# Patient Record
Sex: Female | Born: 1960 | Race: White | Hispanic: No | Marital: Single | Smoking: Never smoker
Health system: Southern US, Community
[De-identification: ages and names within clinical notes are randomized; demographics above are authoritative.]

## PROBLEM LIST (undated history)

## (undated) DIAGNOSIS — Z8619 Personal history of other infectious and parasitic diseases: Secondary | ICD-10-CM

## (undated) DIAGNOSIS — K219 Gastro-esophageal reflux disease without esophagitis: Secondary | ICD-10-CM

## (undated) DIAGNOSIS — T7840XA Allergy, unspecified, initial encounter: Secondary | ICD-10-CM

## (undated) HISTORY — DX: Gastro-esophageal reflux disease without esophagitis: K21.9

## (undated) HISTORY — DX: Personal history of other infectious and parasitic diseases: Z86.19

## (undated) HISTORY — DX: Allergy, unspecified, initial encounter: T78.40XA

---

## 1982-08-11 HISTORY — PX: WISDOM TOOTH EXTRACTION: SHX21

## 1990-08-11 HISTORY — PX: COLONOSCOPY: SHX174

## 2000-06-15 ENCOUNTER — Other Ambulatory Visit: Admission: RE | Admit: 2000-06-15 | Discharge: 2000-06-15 | Payer: Self-pay | Admitting: Obstetrics & Gynecology

## 2000-12-01 ENCOUNTER — Encounter: Admission: RE | Admit: 2000-12-01 | Discharge: 2000-12-01 | Payer: Self-pay | Admitting: Obstetrics & Gynecology

## 2000-12-01 ENCOUNTER — Encounter: Payer: Self-pay | Admitting: Obstetrics & Gynecology

## 2001-07-05 ENCOUNTER — Other Ambulatory Visit: Admission: RE | Admit: 2001-07-05 | Discharge: 2001-07-05 | Payer: Self-pay | Admitting: Obstetrics & Gynecology

## 2002-07-28 ENCOUNTER — Other Ambulatory Visit: Admission: RE | Admit: 2002-07-28 | Discharge: 2002-07-28 | Payer: Self-pay | Admitting: Obstetrics & Gynecology

## 2003-08-21 ENCOUNTER — Other Ambulatory Visit: Admission: RE | Admit: 2003-08-21 | Discharge: 2003-08-21 | Payer: Self-pay | Admitting: Obstetrics & Gynecology

## 2004-10-03 ENCOUNTER — Ambulatory Visit (HOSPITAL_COMMUNITY): Admission: RE | Admit: 2004-10-03 | Discharge: 2004-10-03 | Payer: Self-pay | Admitting: Obstetrics & Gynecology

## 2006-10-30 ENCOUNTER — Ambulatory Visit (HOSPITAL_COMMUNITY): Admission: RE | Admit: 2006-10-30 | Discharge: 2006-10-30 | Payer: Self-pay | Admitting: Obstetrics & Gynecology

## 2006-11-09 ENCOUNTER — Encounter: Admission: RE | Admit: 2006-11-09 | Discharge: 2006-11-09 | Payer: Self-pay | Admitting: Obstetrics & Gynecology

## 2007-08-24 ENCOUNTER — Ambulatory Visit: Payer: Self-pay | Admitting: Surgery

## 2007-08-24 ENCOUNTER — Ambulatory Visit (HOSPITAL_COMMUNITY): Admission: RE | Admit: 2007-08-24 | Discharge: 2007-08-24 | Payer: Self-pay | Admitting: Emergency Medicine

## 2007-08-24 ENCOUNTER — Encounter: Payer: Self-pay | Admitting: Emergency Medicine

## 2007-11-26 ENCOUNTER — Encounter: Admission: RE | Admit: 2007-11-26 | Discharge: 2007-11-26 | Payer: Self-pay | Admitting: Obstetrics & Gynecology

## 2008-12-11 ENCOUNTER — Encounter: Admission: RE | Admit: 2008-12-11 | Discharge: 2008-12-11 | Payer: Self-pay | Admitting: Obstetrics & Gynecology

## 2008-12-18 ENCOUNTER — Encounter: Admission: RE | Admit: 2008-12-18 | Discharge: 2008-12-18 | Payer: Self-pay | Admitting: Obstetrics & Gynecology

## 2010-03-20 ENCOUNTER — Ambulatory Visit (HOSPITAL_COMMUNITY): Admission: RE | Admit: 2010-03-20 | Discharge: 2010-03-20 | Payer: Self-pay | Admitting: Obstetrics and Gynecology

## 2010-04-03 ENCOUNTER — Encounter: Admission: RE | Admit: 2010-04-03 | Discharge: 2010-04-03 | Payer: Self-pay | Admitting: Obstetrics and Gynecology

## 2010-04-09 ENCOUNTER — Encounter: Admission: RE | Admit: 2010-04-09 | Discharge: 2010-04-09 | Payer: Self-pay | Admitting: Obstetrics and Gynecology

## 2010-09-01 ENCOUNTER — Encounter: Payer: Self-pay | Admitting: Obstetrics and Gynecology

## 2011-03-02 ENCOUNTER — Inpatient Hospital Stay (INDEPENDENT_AMBULATORY_CARE_PROVIDER_SITE_OTHER)
Admission: RE | Admit: 2011-03-02 | Discharge: 2011-03-02 | Disposition: A | Discharge status: Home / Self Care | Payer: BC Managed Care – PPO | Source: Ambulatory Visit | Attending: Family Medicine | Admitting: Family Medicine

## 2011-03-02 DIAGNOSIS — L02519 Cutaneous abscess of unspecified hand: Secondary | ICD-10-CM

## 2011-03-02 DIAGNOSIS — L03019 Cellulitis of unspecified finger: Secondary | ICD-10-CM

## 2011-03-04 ENCOUNTER — Inpatient Hospital Stay (HOSPITAL_COMMUNITY)
Admission: RE | Admit: 2011-03-04 | Discharge: 2011-03-04 | Disposition: A | Discharge status: Home / Self Care | Payer: BC Managed Care – PPO | Source: Ambulatory Visit | Attending: Emergency Medicine | Admitting: Emergency Medicine

## 2011-04-24 ENCOUNTER — Other Ambulatory Visit: Payer: Self-pay | Admitting: Obstetrics and Gynecology

## 2011-04-24 DIAGNOSIS — Z1231 Encounter for screening mammogram for malignant neoplasm of breast: Secondary | ICD-10-CM

## 2011-04-30 ENCOUNTER — Ambulatory Visit
Admission: RE | Admit: 2011-04-30 | Discharge: 2011-04-30 | Disposition: A | Discharge status: Home / Self Care | Payer: BC Managed Care – PPO | Source: Ambulatory Visit | Attending: Obstetrics and Gynecology | Admitting: Obstetrics and Gynecology

## 2011-04-30 DIAGNOSIS — Z1231 Encounter for screening mammogram for malignant neoplasm of breast: Secondary | ICD-10-CM

## 2012-04-23 ENCOUNTER — Other Ambulatory Visit: Payer: Self-pay | Admitting: Obstetrics and Gynecology

## 2012-04-23 DIAGNOSIS — Z1231 Encounter for screening mammogram for malignant neoplasm of breast: Secondary | ICD-10-CM

## 2012-05-13 ENCOUNTER — Ambulatory Visit: Payer: BC Managed Care – PPO

## 2012-06-07 ENCOUNTER — Ambulatory Visit
Admission: RE | Admit: 2012-06-07 | Discharge: 2012-06-07 | Disposition: A | Discharge status: Home / Self Care | Payer: BC Managed Care – PPO | Source: Ambulatory Visit | Attending: Obstetrics and Gynecology | Admitting: Obstetrics and Gynecology

## 2012-06-07 DIAGNOSIS — Z1231 Encounter for screening mammogram for malignant neoplasm of breast: Secondary | ICD-10-CM

## 2012-08-25 ENCOUNTER — Ambulatory Visit (INDEPENDENT_AMBULATORY_CARE_PROVIDER_SITE_OTHER): Payer: BC Managed Care – PPO | Admitting: Physician Assistant

## 2012-08-25 VITALS — BP 118/80 | HR 77 | Temp 98.2°F | Resp 16 | Ht 68.5 in | Wt 170.0 lb

## 2012-08-25 DIAGNOSIS — J3489 Other specified disorders of nose and nasal sinuses: Secondary | ICD-10-CM

## 2012-08-25 DIAGNOSIS — R42 Dizziness and giddiness: Secondary | ICD-10-CM

## 2012-08-25 LAB — GLUCOSE, POCT (MANUAL RESULT ENTRY): POC Glucose: 90 mg/dl (ref 70–99)

## 2012-08-25 LAB — POCT CBC
Hemoglobin: 14.4 g/dL (ref 12.2–16.2)
MCV: 95.6 fL (ref 80–97)
POC LYMPH PERCENT: 31.1 %L (ref 10–50)
RDW, POC: 13.4 %
WBC: 6.2 10*3/uL (ref 4.6–10.2)

## 2012-08-25 MED ORDER — GUAIFENESIN ER 1200 MG PO TB12: 1.0000 | ORAL_TABLET | Freq: Two times a day (BID) | ORAL | Status: DC

## 2012-08-25 MED ORDER — MECLIZINE HCL 25 MG PO TABS: 25.0000 mg | ORAL_TABLET | Freq: Four times a day (QID) | ORAL | Status: DC | PRN

## 2012-08-25 NOTE — Progress Notes (Signed)
[address], [place] [address]   Phone [phone]  Subjective:    Patient ID: Hannah Kim, female    DOB: Jan 14, 1961, 52 y.o.   MRN: [id number]  HPI Pt presents to clinic with several hour h/o lightheadedness - She had minor cold-like symptoms last week but never felt like she got really sick - then this am she woke up feeling fine - ate breakfast and then bent down to pick something up and felt lightheaded, no vertigo - she laid down for a little bit and blew the humidifier in her face because her sinuses felt thick - while laying in bed when she would roll her head to the side she would get the same lightheaded feeling, not quite room spinning but the room was moving.  When she sat up she started to get that same feeling again and she got concerned.  She never felt like she was going to pass out.  She has never had this before.  She is having no HA, no weakness.  She is taking no new medications.   Review of Systems  Constitutional: Negative for fever, chills and appetite change.  HENT: Negative for congestion.   Respiratory: Negative for cough, chest tightness and shortness of breath.   Cardiovascular: Negative for chest pain.  Gastrointestinal: Positive for nausea (some with lightheaded feeling). Negative for diarrhea.  Neurological: Positive for dizziness and light-headedness. Negative for weakness, numbness and headaches.       Objective:   Physical Exam  Vitals reviewed. Constitutional: She is oriented to person, place, and time. She appears well-developed and well-nourished.  HENT:  Head: Normocephalic and atraumatic.  Right Ear: External ear normal.  Left Ear: External ear normal.  Nose: Nose normal.  Mouth/Throat: Oropharynx is clear and moist. No oropharyngeal exudate.  Eyes: Conjunctivae normal and EOM are normal. Pupils are equal, round, and reactive to light.       No nystagmus  Neck: Neck supple. No thyromegaly present.  Cardiovascular: Normal rate,  regular rhythm and normal heart sounds.   No murmur heard. Pulmonary/Chest: Effort normal and breath sounds normal.  Lymphadenopathy:    She has no cervical adenopathy.  Neurological: She is alert and oriented to person, place, and time. She has normal reflexes.       Good strength, no weakness or sensation change.  Skin: Skin is warm and dry.  Psychiatric: She has a normal mood and affect. Her behavior is normal. Judgment and thought content normal.    Results for orders placed in visit on 08/25/12  POCT CBC      Component Value Range   WBC 6.2  4.6 - 10.2 K/uL   Lymph, poc 1.9  0.6 - 3.4   POC LYMPH PERCENT 31.1  10 - 50 %L   MID (cbc) 0.4  0 - 0.9   POC MID % 6.8  0 - 12 %M   POC Granulocyte 3.9  2 - 6.9   Granulocyte percent 62.1  37 - 80 %G   RBC 4.82  4.04 - 5.48 M/uL   Hemoglobin 14.4  12.2 - 16.2 g/dL   HCT, POC 01.0  27.2 - 47.9 %   MCV 95.6  80 - 97 fL   MCH, POC 29.9  27 - 31.2 pg   MCHC 31.2 (*) 31.8 - 35.4 g/dL   RDW, POC 53.6     Platelet Count, POC 294  142 - 424 K/uL   MPV 8.7  0 - 99.8 fL  GLUCOSE, POCT (MANUAL RESULT ENTRY)      Component Value Range   POC Glucose 90  70 - 99 mg/dl         Assessment & Plan:   1. Lightheaded  POCT CBC, POCT glucose (manual entry), meclizine (ANTIVERT) 25 MG tablet  2. Sinus pressure  Guaifenesin (MUCINEX MAXIMUM STRENGTH) 1200 MG TB12   I think pt may have mild benign positional vertigo due to head position change causing sensation of room movement.  With normal labs and exam pt will return home and be careful with changing position to decreased her chances as getting hurt.  She will treat her sinus congestion with Mucinex and humidifier to see if this is aggravating her symptoms today.  She was given Antivert to use if needed.  She will f/u if worsening symptoms.

## 2013-06-27 ENCOUNTER — Other Ambulatory Visit: Payer: Self-pay

## 2013-06-27 DIAGNOSIS — Z1231 Encounter for screening mammogram for malignant neoplasm of breast: Secondary | ICD-10-CM

## 2013-07-26 ENCOUNTER — Ambulatory Visit
Admission: RE | Admit: 2013-07-26 | Discharge: 2013-07-26 | Disposition: A | Discharge status: Home / Self Care | Payer: BC Managed Care – PPO | Source: Ambulatory Visit

## 2013-07-26 DIAGNOSIS — Z1231 Encounter for screening mammogram for malignant neoplasm of breast: Secondary | ICD-10-CM

## 2013-08-02 ENCOUNTER — Other Ambulatory Visit: Payer: Self-pay | Admitting: Obstetrics and Gynecology

## 2013-08-02 DIAGNOSIS — R928 Other abnormal and inconclusive findings on diagnostic imaging of breast: Secondary | ICD-10-CM

## 2013-08-11 HISTORY — PX: COLONOSCOPY, ESOPHAGOGASTRODUODENOSCOPY (EGD) AND ESOPHAGEAL DILATION: SHX5781

## 2013-09-12 ENCOUNTER — Ambulatory Visit
Admission: RE | Admit: 2013-09-12 | Discharge: 2013-09-12 | Disposition: A | Discharge status: Home / Self Care | Payer: BC Managed Care – PPO | Source: Ambulatory Visit | Attending: Obstetrics and Gynecology | Admitting: Obstetrics and Gynecology

## 2013-09-12 ENCOUNTER — Other Ambulatory Visit: Payer: Self-pay | Admitting: Obstetrics and Gynecology

## 2013-09-12 DIAGNOSIS — R928 Other abnormal and inconclusive findings on diagnostic imaging of breast: Secondary | ICD-10-CM

## 2014-05-09 ENCOUNTER — Ambulatory Visit (INDEPENDENT_AMBULATORY_CARE_PROVIDER_SITE_OTHER): Payer: BC Managed Care – PPO | Admitting: Physician Assistant

## 2014-05-09 VITALS — BP 108/74 | HR 74 | Temp 98.7°F | Resp 16 | Ht 68.0 in | Wt 146.4 lb

## 2014-05-09 DIAGNOSIS — R42 Dizziness and giddiness: Secondary | ICD-10-CM

## 2014-05-09 MED ORDER — MECLIZINE HCL 25 MG PO TABS: 25.0000 mg | ORAL_TABLET | Freq: Four times a day (QID) | ORAL | Status: DC | PRN

## 2014-05-09 NOTE — Progress Notes (Signed)
   Subjective:    Patient ID: Hannah Kim, female    DOB: 04-19-1961, 53 y.o.   MRN: [id number]  HPI Pt presents to clinic with vertigo again.  It started in August after a plane ride and ear pressure after the decent. She has not had is constant but over the last 2 weeks it is becoming more frequent and very often happens when she looks to the right and backwards she gets the feeling like the room is spinning.  She has been using antihistamines but they are not helpings.  She has not had a recent cold.  She has not ear pain, tinnitus or hearing loss.  She is not getting nauseated from her vertigo.  Review of Systems  Constitutional: Negative for fever and chills.  HENT: Negative.  Negative for ear pain and hearing loss.   Neurological: Positive for dizziness. Negative for weakness, light-headedness, numbness and headaches.       Objective:   Physical Exam  Vitals reviewed. Constitutional: She appears well-developed and well-nourished.  HENT:  Head: Normocephalic and atraumatic.  Right Ear: Hearing, tympanic membrane, external ear and ear canal normal.  Left Ear: Hearing, tympanic membrane, external ear and ear canal normal.  Nose: Nose normal.  Mouth/Throat: Uvula is midline, oropharynx is clear and moist and mucous membranes are normal.  Eyes: Conjunctivae, EOM and lids are normal. Pupils are equal, round, and reactive to light. Right eye exhibits no nystagmus. Left eye exhibits no nystagmus.  Dix Hallpike- no nystagmus but with both left < right head movement pt experiences room spinning - the worst is when she is sat up from the maneuvers.  Epley maneuvers performed - afterwards pt states vertigo is almost gone.  Cardiovascular: Normal rate, regular rhythm and normal heart sounds.   No murmur heard. Pulmonary/Chest: Effort normal and breath sounds normal. She has no wheezes.  Neurological: She is alert. She has normal strength. No cranial nerve deficit or sensory deficit.        Assessment & Plan:  Vertigo - most likely BPPV - Plan: meclizine (ANTIVERT) 25 MG tablet  Pt will stay well hydrated and RTC in 5 days if no better.  Benny LennertSarah Weber PA-C  [facility] 05/09/2014 6:25 PM

## 2014-09-02 LAB — HM COLONOSCOPY

## 2015-01-01 ENCOUNTER — Ambulatory Visit (INDEPENDENT_AMBULATORY_CARE_PROVIDER_SITE_OTHER): Payer: BLUE CROSS/BLUE SHIELD | Admitting: Urgent Care

## 2015-01-01 VITALS — BP 110/68 | HR 79 | Temp 97.5°F | Resp 16 | Ht 69.0 in | Wt 159.0 lb

## 2015-01-01 DIAGNOSIS — L03119 Cellulitis of unspecified part of limb: Secondary | ICD-10-CM | POA: Diagnosis not present

## 2015-01-01 MED ORDER — CEPHALEXIN 500 MG PO CAPS: 500.0000 mg | ORAL_CAPSULE | Freq: Three times a day (TID) | ORAL | Status: AC

## 2015-01-01 NOTE — Patient Instructions (Signed)

## 2015-01-01 NOTE — Progress Notes (Signed)
    MRN: [id number] DOB: [birth year]  Subjective:   Hannah Kim is a 54 y.o. female presenting for chief complaint of Arm Infection  Reports ~1.5 week history of redness over her left forearm. Patient was working in her yard and sustained a prick from a thorn off of rose bush. She has since had some redness, slight itching. She has not tried any medications for relief. Denies fevers, swelling, pain, drainage of pus or blood. She has previously had the same injury to her right hand which became infected and needed antibiotics. That episode was different however, had pain, swelling, redness. Also reports a history of severe contact dermatitis to poison ivy but denies coming into contact with it during this episode. Denies any other aggravating or relieving factors, no other questions or concerns.  Hannah Kim currently has no medications in their medication list. She is allergic to sulfa antibiotics.  Hannah Kim  has a past medical history of Allergy and GERD (gastroesophageal reflux disease). Also  has no past surgical history on file.  ROS As in subjective.  Objective:   Vitals: BP 110/68 mmHg  Pulse 79  Temp(Src) 97.5 F (36.4 C) (Oral)  Resp 16  Ht 5\' 9"  (1.753 m)  Wt 159 lb (72.122 kg)  BMI 23.47 kg/m2  SpO2 98%  Physical Exam  Constitutional: She is oriented to person, place, and time. She appears well-developed and well-nourished.  Cardiovascular: Normal rate.   Pulmonary/Chest: Effort normal.  Neurological: She is alert and oriented to person, place, and time.  Skin: Skin is warm and dry. No rash noted. There is erythema. No pallor.      Assessment and Plan :   1. Cellulitis of upper extremity, unspecified laterality - Start oral antihistamine for itching and redness, start Keflex if there is no improvement over the next 2-3 days, rx printed. Worsening signs of infection that would need I&D reviewed. Patient acknowledge and agreed to rtc if these symptoms develop.  Wallis BambergMario  Yishai Rehfeld, PA-C [facility] [phone] 01/01/2015 8:24 AM

## 2015-05-28 DIAGNOSIS — Q279 Congenital malformation of peripheral vascular system, unspecified: Secondary | ICD-10-CM | POA: Insufficient documentation

## 2016-09-02 LAB — HM MAMMOGRAPHY: HM Mammogram: NORMAL (ref 0–4)

## 2016-09-02 LAB — HM PAP SMEAR

## 2017-05-19 DIAGNOSIS — Z713 Dietary counseling and surveillance: Secondary | ICD-10-CM | POA: Diagnosis not present

## 2017-05-19 DIAGNOSIS — Z13220 Encounter for screening for lipoid disorders: Secondary | ICD-10-CM | POA: Diagnosis not present

## 2017-05-19 DIAGNOSIS — Z131 Encounter for screening for diabetes mellitus: Secondary | ICD-10-CM | POA: Diagnosis not present

## 2017-05-19 DIAGNOSIS — Z23 Encounter for immunization: Secondary | ICD-10-CM | POA: Diagnosis not present

## 2017-05-19 DIAGNOSIS — Z136 Encounter for screening for cardiovascular disorders: Secondary | ICD-10-CM | POA: Diagnosis not present

## 2017-06-11 DIAGNOSIS — H6121 Impacted cerumen, right ear: Secondary | ICD-10-CM | POA: Diagnosis not present

## 2017-06-11 DIAGNOSIS — H93291 Other abnormal auditory perceptions, right ear: Secondary | ICD-10-CM | POA: Diagnosis not present

## 2017-09-02 ENCOUNTER — Encounter: Payer: Self-pay | Admitting: Family Medicine

## 2017-09-02 ENCOUNTER — Other Ambulatory Visit: Payer: Self-pay

## 2017-09-02 ENCOUNTER — Ambulatory Visit (INDEPENDENT_AMBULATORY_CARE_PROVIDER_SITE_OTHER): Payer: BLUE CROSS/BLUE SHIELD | Admitting: Family Medicine

## 2017-09-02 VITALS — BP 102/68 | HR 61 | Temp 98.1°F | Resp 16 | Ht 69.0 in | Wt 172.2 lb

## 2017-09-02 DIAGNOSIS — Z0000 Encounter for general adult medical examination without abnormal findings: Secondary | ICD-10-CM

## 2017-09-02 DIAGNOSIS — E785 Hyperlipidemia, unspecified: Secondary | ICD-10-CM | POA: Insufficient documentation

## 2017-09-02 LAB — CBC WITH DIFFERENTIAL/PLATELET
BASOS ABS: 0 10*3/uL (ref 0.0–0.1)
Basophils Relative: 0.8 % (ref 0.0–3.0)
EOS ABS: 0.1 10*3/uL (ref 0.0–0.7)
Eosinophils Relative: 2.6 % (ref 0.0–5.0)
HCT: 40 % (ref 36.0–46.0)
Hemoglobin: 13.4 g/dL (ref 12.0–15.0)
LYMPHS ABS: 1.8 10*3/uL (ref 0.7–4.0)
LYMPHS PCT: 32.1 % (ref 12.0–46.0)
MCHC: 33.6 g/dL (ref 30.0–36.0)
MCV: 92.6 fl (ref 78.0–100.0)
MONO ABS: 0.4 10*3/uL (ref 0.1–1.0)
Monocytes Relative: 6.6 % (ref 3.0–12.0)
NEUTROS ABS: 3.3 10*3/uL (ref 1.4–7.7)
NEUTROS PCT: 57.9 % (ref 43.0–77.0)
PLATELETS: 208 10*3/uL (ref 150.0–400.0)
RBC: 4.32 Mil/uL (ref 3.87–5.11)
RDW: 13.8 % (ref 11.5–15.5)
WBC: 5.6 10*3/uL (ref 4.0–10.5)

## 2017-09-02 LAB — LIPID PANEL
CHOLESTEROL: 210 mg/dL — AB (ref 0–200)
HDL: 67.4 mg/dL (ref >39.00)
LDL Cholesterol: 126 mg/dL — ABNORMAL HIGH (ref 0–99)
NonHDL: 142.14
Total CHOL/HDL Ratio: 3
Triglycerides: 81 mg/dL (ref 0.0–149.0)
VLDL: 16.2 mg/dL (ref 0.0–40.0)

## 2017-09-02 LAB — HEPATIC FUNCTION PANEL
ALK PHOS: 68 U/L (ref 39–117)
ALT: 14 U/L (ref 0–35)
AST: 19 U/L (ref 0–37)
Albumin: 4.4 g/dL (ref 3.5–5.2)
BILIRUBIN DIRECT: 0.1 mg/dL (ref 0.0–0.3)
TOTAL PROTEIN: 7.4 g/dL (ref 6.0–8.3)
Total Bilirubin: 0.4 mg/dL (ref 0.2–1.2)

## 2017-09-02 LAB — BASIC METABOLIC PANEL
BUN: 20 mg/dL (ref 6–23)
CALCIUM: 9.7 mg/dL (ref 8.4–10.5)
CO2: 21 meq/L (ref 19–32)
Chloride: 107 mEq/L (ref 96–112)
Creatinine, Ser: 0.72 mg/dL (ref 0.40–1.20)
GFR: 89.33 mL/min (ref >60.00)
GLUCOSE: 86 mg/dL (ref 70–99)
Potassium: 4.8 mEq/L (ref 3.5–5.1)
SODIUM: 142 meq/L (ref 135–145)

## 2017-09-02 LAB — TSH: TSH: 2.26 u[IU]/mL (ref 0.35–4.50)

## 2017-09-02 NOTE — Progress Notes (Signed)
   Subjective:    Patient ID: Hannah Kim, female    DOB: 02-19-1961, 57 y.o.   MRN: [id number]  HPI New to establish.  [place] from [place].  UTD on pap, mammo, colonoscopy.  UTD on flu and Tdap.   Review of Systems Patient reports no vision/ hearing changes, adenopathy,fever, weight change,  persistant/recurrent hoarseness , swallowing issues, chest pain, palpitations, edema, persistant/recurrent cough, hemoptysis, dyspnea (rest/exertional/paroxysmal nocturnal), gastrointestinal bleeding (melena, rectal bleeding), abdominal pain, significant heartburn, bowel changes, GU symptoms (dysuria, hematuria, incontinence), Gyn symptoms (abnormal  bleeding, pain),  syncope, focal weakness, memory loss, numbness & tingling, skin/hair/nail changes, abnormal bruising or bleeding, anxiety, or depression.     Objective:   Physical Exam General Appearance:    Alert, cooperative, no distress, appears stated age  Head:    Normocephalic, without obvious abnormality, atraumatic  Eyes:    PERRL, conjunctiva/corneas clear, EOM's intact, fundi    benign, both eyes  Ears:    Normal TM's and external ear canals, both ears  Nose:   Nares normal, septum midline, mucosa normal, no drainage    or sinus tenderness  Throat:   Lips, mucosa, and tongue normal; teeth and gums normal  Neck:   Supple, symmetrical, trachea midline, no adenopathy;    Thyroid: no enlargement/tenderness/nodules  Back:     Symmetric, no curvature, ROM normal, no CVA tenderness  Lungs:     Clear to auscultation bilaterally, respirations unlabored  Chest Wall:    No tenderness or deformity   Heart:    Regular rate and rhythm, S1 and S2 normal, no murmur, rub   or gallop  Breast Exam:    Deferred to GYN  Abdomen:     Soft, non-tender, bowel sounds active all four quadrants,    no masses, no organomegaly  Genitalia:    Deferred to GYN  Rectal:    Extremities:   Extremities normal, atraumatic, no  cyanosis or edema  Pulses:   2+ and symmetric all extremities  Skin:   Skin color, texture, turgor normal, no rashes or lesions.  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

## 2017-09-02 NOTE — Assessment & Plan Note (Signed)
Pt's PE WNL.  UTD on GYN, colonoscopy, immunizations.  Check labs.  Anticipatory guidance provided.  

## 2017-09-02 NOTE — Assessment & Plan Note (Signed)
Noted on previous labs.  Check labs and start tx prn.

## 2017-09-02 NOTE — Patient Instructions (Signed)
Follow up in 1 year or as needed We'll notify you of your lab results and make any changes if needed Keep up the good work!  You look great! Call with any questions or concerns Welcome!  We're glad to have you! 

## 2017-09-03 ENCOUNTER — Encounter: Payer: Self-pay | Admitting: General Practice

## 2017-10-01 DIAGNOSIS — Z01419 Encounter for gynecological examination (general) (routine) without abnormal findings: Secondary | ICD-10-CM | POA: Diagnosis not present

## 2017-10-01 DIAGNOSIS — Z130 Encounter for screening for diseases of the blood and blood-forming organs and certain disorders involving the immune mechanism: Secondary | ICD-10-CM | POA: Diagnosis not present

## 2017-10-01 DIAGNOSIS — Z1231 Encounter for screening mammogram for malignant neoplasm of breast: Secondary | ICD-10-CM | POA: Diagnosis not present

## 2017-10-01 DIAGNOSIS — Z6826 Body mass index (BMI) 26.0-26.9, adult: Secondary | ICD-10-CM | POA: Diagnosis not present

## 2017-10-01 DIAGNOSIS — Z1389 Encounter for screening for other disorder: Secondary | ICD-10-CM | POA: Diagnosis not present

## 2017-10-01 LAB — HM MAMMOGRAPHY: HM MAMMO: NORMAL (ref 0–4)

## 2017-10-02 DIAGNOSIS — Z124 Encounter for screening for malignant neoplasm of cervix: Secondary | ICD-10-CM | POA: Diagnosis not present

## 2017-10-05 ENCOUNTER — Encounter: Payer: Self-pay | Admitting: General Practice

## 2017-11-13 ENCOUNTER — Ambulatory Visit (INDEPENDENT_AMBULATORY_CARE_PROVIDER_SITE_OTHER): Payer: BLUE CROSS/BLUE SHIELD | Admitting: Family Medicine

## 2017-11-13 ENCOUNTER — Other Ambulatory Visit: Payer: Self-pay

## 2017-11-13 ENCOUNTER — Encounter: Payer: Self-pay | Admitting: Family Medicine

## 2017-11-13 VITALS — BP 110/62 | HR 85 | Temp 97.9°F | Resp 16 | Ht 69.0 in | Wt 171.1 lb

## 2017-11-13 DIAGNOSIS — M25561 Pain in right knee: Secondary | ICD-10-CM | POA: Diagnosis not present

## 2017-11-13 DIAGNOSIS — G8929 Other chronic pain: Secondary | ICD-10-CM

## 2017-11-13 DIAGNOSIS — M79674 Pain in right toe(s): Secondary | ICD-10-CM | POA: Diagnosis not present

## 2017-11-13 NOTE — Patient Instructions (Signed)
Follow up as needed or as scheduled Continue Tylenol or Aleve as needed ICE! Elevate as needed We'll call you with your Sports Medicine appt Call with any questions or concerns Hang in there! Happy Spring!

## 2017-11-13 NOTE — Progress Notes (Signed)
   Subjective:    Patient ID: Hannah Kim, female    DOB: 26-Sep-1960, 57 y.o.   MRN: [id number]  HPI Toe pain- R great toe has 'always been kinda stiff' w/ pain at MTP joint.  Pt did a lot of yard work over the weekend and stubbed her toe.  Had aching of her R great toe along w/ 'burning' of the other toes.  She was able to rest and ice multiple times/day which improved her pain.  B/c she was altering her gait due to foot pain, she aggravated a previous R knee injury (she has previously done PT).  Then bc she was favoring the R knee, she developed pain in the L knee.  No improvement w/ Aleve but some improvement w/ Tylenol.   Review of Systems For ROS see HPI     Objective:   Physical Exam  Constitutional: She is oriented to person, place, and time. She appears well-developed and well-nourished. No distress.  HENT:  Head: Normocephalic and atraumatic.  Musculoskeletal: She exhibits tenderness (TTP of R MTP). She exhibits no edema or deformity.  Bruising of R great toenail Dropped transverse arch on R No TTP of other toes or plantar surface of R foot  Neurological: She is alert and oriented to person, place, and time.  Skin: Skin is warm and dry. No erythema.  Vitals reviewed.         Assessment & Plan:  R great toe pain- new.  Pt stubbed her toe over the weekend while gardening and now has bruised nail and has aggravated a previously painful and stiff R MTP joint.  She has a dropped transverse arch of R foot which may be contributing to foot pain.  Her pain in her foot caused her to alter her gait- re-aggravating R chronic knee pain.  Her change in gait b/c of the R knee pain, caused L knee pain.  Given her multiple sxs resulting from gait changes, will refer to sports med for complete evaluation and tx.  Pt expressed understanding and is in agreement w/ plan.

## 2017-12-02 ENCOUNTER — Ambulatory Visit: Payer: BLUE CROSS/BLUE SHIELD | Admitting: Sports Medicine

## 2017-12-07 ENCOUNTER — Ambulatory Visit (INDEPENDENT_AMBULATORY_CARE_PROVIDER_SITE_OTHER): Payer: BLUE CROSS/BLUE SHIELD | Admitting: Sports Medicine

## 2017-12-07 ENCOUNTER — Encounter: Payer: Self-pay | Admitting: Sports Medicine

## 2017-12-07 VITALS — BP 104/70 | HR 67 | Ht 68.0 in | Wt 176.6 lb

## 2017-12-07 DIAGNOSIS — M2021 Hallux rigidus, right foot: Secondary | ICD-10-CM

## 2017-12-07 DIAGNOSIS — M25562 Pain in left knee: Secondary | ICD-10-CM | POA: Diagnosis not present

## 2017-12-07 DIAGNOSIS — R29898 Other symptoms and signs involving the musculoskeletal system: Secondary | ICD-10-CM | POA: Diagnosis not present

## 2017-12-07 DIAGNOSIS — G8929 Other chronic pain: Secondary | ICD-10-CM

## 2017-12-07 DIAGNOSIS — M25561 Pain in right knee: Secondary | ICD-10-CM | POA: Diagnosis not present

## 2017-12-07 MED ORDER — DICLOFENAC SODIUM 2 % TD SOLN: 1.0000 "application " | Freq: Two times a day (BID) | TRANSDERMAL | 0 refills | Status: AC

## 2017-12-07 MED ORDER — DICLOFENAC SODIUM 2 % TD SOLN: 1.0000 "application " | Freq: Two times a day (BID) | TRANSDERMAL | 2 refills | Status: DC

## 2017-12-07 NOTE — Progress Notes (Signed)
Hannah Kim. [facility] [phone]  Hannah Kim - 57 y.o. female MRN [id number]  Date of birth: 1961/03/05  Visit Date: 12/07/2017  PCP: Sheliah Hatch, MD   Referred by: Sheliah Hatch, MD  Scribe for today's visit: Stevenson Clinch, CMA     SUBJECTIVE:  Hannah Kim is here for Initial Assessment ((pain) R knee, R great toe) .  Referred by: Dr. Neena Rhymes Her L knee pain symptoms INITIALLY: Began several months ago but has worsened over the past month. Described as moderate aching, , radiating to LE, hips, and lower back.  Worsened with weight bearing, bending, standing after sitting for prolonged periods of time.  Improved with rest. Additional associated symptoms include: She injured her R great toe in the past and always has stiffness in the foot. She stubbed her R great toe the end of last month which caused pain to worsen. She also has hx of injury to the R knee (meniscal injury, baker's cyst). She has been favoring the R leg since she stubbed her toe which seems to be causing the L knee pain. She has also noticed more pain in her lower back and hips. She has been leaning over more while doing yard work instead of kneeling d/t her knee pain.     At this time symptoms show no change compared to onset. The tightness around the L knee has subsided slightly. She has tried taking Tylenol with some relief but it cause GI upset. She also tried Aleve but it didn't help as much as the tylenol.   XR R knee 06/30/2016 at [facility]. XR R foot 03/25/16 at [facility].  ROS Reports night time disturbances. Denies fevers, chills, or night sweats. Denies unexplained weight loss. Denies personal history of cancer. Denies changes in bowel or bladder habits. Denies recent unreported falls. Denies new or worsening dyspnea or wheezing. Denies headaches or dizziness.  Denies numbness, tingling or weakness  In the  extremities.  Denies dizziness or presyncopal episodes Denies lower extremity edema    HISTORY & PERTINENT PRIOR DATA:  Prior History reviewed and updated per electronic medical record.  Significant/pertinent history, findings, studies include:  reports that she has never smoked. She has never used smokeless tobacco. No results for input(s): HGBA1C, LABURIC, CREATINE in the last 8760 hours. No specialty comments available. No problems updated.  OBJECTIVE:  VS:  HT:5\' 8"  (172.7 cm)   WT:176 lb 9.6 oz (80.1 kg)  BMI:26.86    BP:104/70  HR:67bpm  TEMP: ( )  RESP:97 %   PHYSICAL EXAM: Constitutional: WDWN, Non-toxic appearing. Psychiatric: Alert & appropriately interactive.  Not depressed or anxious appearing. Respiratory: No increased work of breathing.  Trachea Midline Eyes: Pupils are equal.  EOM intact without nystagmus.  No scleral icterus  Vascular Exam: warm to touch no edema  lower extremity neuro exam: unremarkable normal strength normal sensation  MSK Exam: Bilateral knees with slight synovitis without significant effusion.  Ligaments distally stable.  Extensor mechanism intact.  Her VMO strength is diminished and hip abduction strength is decreased on the left compared to the right but is diminished bilaterally in fact.  Her bilateral feet have limitations in the range of motion of her great toe.  She has no significant tenderness or pain with forefoot squeeze test.   ASSESSMENT & PLAN:   1. Chronic pain of both knees   2. Weakness of both hips   3. Hallux  rigidus of right foot     PLAN: Topical anti-inflammatories provided today.  Discussed the foundation of treatment for this condition is physical therapy and/or daily (5-6 days/week) therapeutic exercises, focusing on core strengthening, coordination, neuromuscular control/reeducation.  Therapeutic exercises prescribed per procedure note.  Follow-up: Return in about 6 weeks (around 01/18/2018).        Please see additional documentation for Objective, Assessment and Plan sections. Pertinent additional documentation may be included in corresponding procedure notes, imaging studies, problem based documentation and patient instructions. Please see these sections of the encounter for additional information regarding this visit.  CMA/ATC served as Neurosurgeon during this visit. History, Physical, and Plan performed by medical provider. Documentation and orders reviewed and attested to.      Andrena Mews, DO    [place] [facility] Physician

## 2017-12-07 NOTE — Patient Instructions (Signed)
Please perform the exercise program that we have prepared for you and gone over in detail on a daily basis.  In addition to the handout you were provided you can access your program through: [email or web address]   Your unique program code is:  [id number]   Also check out "[employer]" which is a program developed by Dr. Myles Lipps.   There are links to a couple of his YouTube Videos below and I would like to see performing one of his videos 5-6 days per week.    A good intro video is: "Independence from Pain 7-minute Video" - [email or web address]   His more advanced video is: "Powerful Posture and Pain Relief: 12 minutes of Foundation Training" - https://youtu.be/[email or web address]  Do not try to attempt this entire video when first beginning.    Try breaking of each exercise that he goes into shorter segments.  Otherwise if they perform an exercise for 45 seconds, start with 15 seconds and rest and then resume when they begin the new activity.    If you work your way up to doing this 12 minute video, I expect you will see significant improvements in your pain.  If you enjoy his videos and would like to find out more you can look on his website: [email or web address].  He has a workout streaming option as well as a DVD set available for purchase.  Amazon has the best price for his DVDs.      [facility] instructions for Duexis, Pennsaid and Vimovo:  Your prescription will be filled through a mail order pharmacy.  It is typically [facility] but may vary depending on where you live.  You will receive a phone call from them which will typically come from a [phone] phone number.  You must speak directly to them to have this medication filled.  When the pharmacy calls, they will need your mailing address (for overnight shipment of the medication) andy they will need payment information if you have a copay (typically no more than $10). If you have not heard from them 2-3  days after your appointment with Dr. Berline Chough, contact us at the office [phone]) or through MyChart so we can reach back out to the pharmacy.

## 2017-12-07 NOTE — Progress Notes (Signed)
PROCEDURE NOTE: THERAPEUTIC EXERCISES (97110) 15 minutes spent for Therapeutic exercises as below and as referenced in the AVS.  This included exercises focusing on stretching, strengthening, with significant focus on eccentric aspects.   Proper technique shown and discussed handout in great detail with ATC.  All questions were discussed and answered.   Long term goals include an improvement in range of motion, strength, endurance as well as avoiding reinjury. Frequency of visits is one time as determined during today's  office visit. Frequency of exercises to be performed is as per handout.  EXERCISES REVIEWED: Goodman Exercises Hip ABduction strengthening with focus on Glute Medius Recruitment VMO Strengthening 

## 2017-12-11 ENCOUNTER — Ambulatory Visit: Payer: Self-pay | Admitting: *Deleted

## 2017-12-11 NOTE — Telephone Encounter (Signed)
Pt called in and was given some pensaid for her knee pain. She said that she is haven a little nausea. She stated that she also feels this way when she take ibuprofen. She would like to talk to a nurse about the sensitive to this med. Pt has no other symptoms. She has stopped using the pensaid for now  Best number - [phone]     Monday night patient started using the medication- she had little nausea. Patient states she accidentally applied a double dose after working out and showering and thinks that increased her GI symptoms. She has had increased nausea this week. She also has had loose stools. Patient applied last dose at noon yesterday-and she has stopped using the medication.Patient states she has felt some better since 10:30 today. Patient is going to wait and see if she improves in how she feels. Patient thinks that she is sensitive to this medication and she is going to try the exercises that she was advised to try. She will contact the office if she does not improve.

## 2017-12-11 NOTE — Telephone Encounter (Signed)
See note

## 2017-12-14 NOTE — Telephone Encounter (Signed)
Spoke with patient and she advised that GI sx have resolved since stopping Pennsaid. She continues to do home exercises and ice prn. She feels like these thing are helping with pain. She will keep scheduled f/u. Forwarding to Dr. Berline Chough as Lorain Childes.

## 2018-01-18 ENCOUNTER — Ambulatory Visit (INDEPENDENT_AMBULATORY_CARE_PROVIDER_SITE_OTHER): Payer: Managed Care, Other (non HMO) | Admitting: Sports Medicine

## 2018-01-18 ENCOUNTER — Encounter: Payer: Self-pay | Admitting: Sports Medicine

## 2018-01-18 ENCOUNTER — Ambulatory Visit: Payer: BLUE CROSS/BLUE SHIELD | Admitting: Sports Medicine

## 2018-01-18 VITALS — BP 110/72 | HR 69 | Ht 68.0 in | Wt 173.2 lb

## 2018-01-18 DIAGNOSIS — G8929 Other chronic pain: Secondary | ICD-10-CM

## 2018-01-18 DIAGNOSIS — R29898 Other symptoms and signs involving the musculoskeletal system: Secondary | ICD-10-CM

## 2018-01-18 DIAGNOSIS — M2021 Hallux rigidus, right foot: Secondary | ICD-10-CM

## 2018-01-18 DIAGNOSIS — M25562 Pain in left knee: Secondary | ICD-10-CM | POA: Diagnosis not present

## 2018-01-18 DIAGNOSIS — M25561 Pain in right knee: Secondary | ICD-10-CM | POA: Diagnosis not present

## 2018-01-18 NOTE — Patient Instructions (Signed)
Look into having your insurance company cover a set of custom orthotics.  The code is L3030 and there are 2 units.  You can call them  and ask if this is covered.  I am happy to do these for you at any time, you just need to let our front office schedulers know you would like an "orthotic appointment."  Please also make sure you bring athletic shoes with you on the day of your orthotic appointment or whatever shoes you plan to wear your orthotics in most frequently.  

## 2018-01-18 NOTE — Progress Notes (Signed)
Veverly FellsMichael D. Delorise Shinerigby, DO  [facility] [phone]  Jearl KlinefelterLynn A Kim - 57 y.o. female MRN [id number]  Date of birth: February 18, 1961  Visit Date: 01/18/2018  PCP: Hannah Hatchabori, Hannah E, MD   Referred by: Hannah Hatchabori, Hannah E, MD  Scribe for today's visit: Hannah ClinchBrandy Kim, CMA     SUBJECTIVE:  Hannah MedinaLynn A Kim is here for Follow-up (bilateral knee pain, L>R)  12/07/2017: Her L knee pain symptoms INITIALLY: Began several months ago but has worsened over the past month. Described as moderate aching, , radiating to LE, hips, and lower back.  Worsened with weight bearing, bending, standing after sitting for prolonged periods of time.  Improved with rest. Additional associated symptoms include: She injured her R great toe in the past and always has stiffness in the foot. She stubbed her R great toe the end of last month which caused pain to worsen. She also has hx of injury to the R knee (meniscal injury, baker's cyst). She has been favoring the R leg since she stubbed her toe which seems to be causing the L knee pain. She has also noticed more pain in her lower back and hips. She has been leaning over more while doing yard work instead of kneeling d/t her knee pain.    At this time symptoms show no change compared to onset. The tightness around the L knee has subsided slightly. She has tried taking Tylenol with some relief but it cause GI upset. She also tried Aleve but it didn't help as much as the tylenol.  [facility]  01/18/2018: Compared to the last office visit, her previously described symptoms are improving, her knee is not bothering her at night anymore. She does still have occasional twinge of pain when walking. Pain is worse when standing or sitting for prolonged periods of time. She denies swelling.  Current symptoms are mild-moderate & are non-radiating. She reports that bilateral hip and  LBP have improved.  She has been doing HEP with no trouble. She has increased her reps to 15. She has not tried [employer] exercises. She is no longer taking Tylneol or Aleve.   She c/o continued burning and pain in her R foot.   ROS Denies night time disturbances. Denies fevers, chills, or night sweats. Denies unexplained weight loss. Denies personal history of cancer. Denies changes in bowel or bladder habits. Denies recent unreported falls. Denies new or worsening dyspnea or wheezing. Denies headaches or dizziness.  Denies numbness, tingling or weakness  In the extremities.  Denies dizziness or presyncopal episodes Denies lower extremity edema    HISTORY & PERTINENT PRIOR DATA:  Prior History reviewed and updated per electronic medical record.  Significant/pertinent history, findings, studies include:  reports that she has never smoked. She has never used smokeless tobacco. No results for input(s): HGBA1C, LABURIC, CREATINE in the last 8760 hours. No specialty comments available. No problems updated.  OBJECTIVE:  VS:  HT:5\' 8"  (172.7 cm)   WT:173 lb 3.2 oz (78.6 kg)  BMI:26.34    BP:110/72  HR:69bpm  TEMP: ( )  RESP:97 %   PHYSICAL EXAM: Constitutional: WDWN, Non-toxic appearing. Psychiatric: Alert & appropriately interactive.  Not depressed or anxious appearing. Respiratory: No increased work of breathing.  Trachea Midline Eyes: Pupils are equal.  EOM intact without nystagmus.  No scleral icterus  Vascular Exam: warm to touch no edema  lower extremity neuro exam: unremarkable normal  strength normal sensation normal reflexes  MSK Exam: Negative straight leg raise bilaterally.  Hip strength is improved.  Hip flexor tightness is improved.  Loss of transverse arch with weightbearing with splay toe between first and second ray.  Hallux rigidus right foot   ASSESSMENT & PLAN:   1. Chronic pain of both knees   2. Weakness of both hips   3. Hallux rigidus of  right foot     PLAN: Continue home therapeutic exercise previously prescribed.  Custom cushioned insoles discussed today and she will plan to look into these and will schedule appointment if interested.  Otherwise continue with over-the-counter insoles.  Follow-up: Return in about 8 weeks (around 03/15/2018).      Please see additional documentation for Objective, Assessment and Plan sections. Pertinent additional documentation may be included in corresponding procedure notes, imaging studies, problem based documentation and patient instructions. Please see these sections of the encounter for additional information regarding this visit.  CMA/ATC served as Neurosurgeon during this visit. History, Physical, and Plan performed by medical provider. Documentation and orders reviewed and attested to.      Andrena Mews, DO    [facility]ician

## 2018-01-19 ENCOUNTER — Encounter: Payer: Self-pay | Admitting: Sports Medicine

## 2018-01-26 ENCOUNTER — Ambulatory Visit: Payer: Self-pay | Admitting: *Deleted

## 2018-01-26 NOTE — Telephone Encounter (Signed)
Pt calling with increased redness around the outer part of right ankle. Pt states on Sunday she removed "something dark" from her foot that she thought was a splinter but is not sure. Pt states the area where the object was removed is about the size of a pencil eraser and sometimes has clear drainage that is watery and becomes crusted over. Pt states that the area of redness is not quite quarter sized and the area of redness was noted to increase today. Pt denies any pain to the area or fever. Pt also mentions that next to the area there is a bump that he is unsure if it is a bug bite or poison ivy. Pt scheduled for appt on 6/19 with Dr. Beverely Lowabori.  Reason for Disposition . [1] Looks infected (spreading redness, pus) AND [2] no fever  Answer Assessment - Initial Assessment Questions 1. MECHANISM: "How did the injury happen?" (e.g., twisting injury, direct blow)       2. ONSET: "When did the injury happen?" (Minutes or hours ago)      Sunday 3. LOCATION: "Where is the injury located?"      Right outer ankle 4. APPEARANCE of INJURY: "What does the injury look like?"      Red with some 5. WEIGHT-BEARING: "Can you put weight on that foot?" "Can you walk (four steps or more)?"       Can walk on the foot 6. SIZE: For cuts, bruises, or swelling, ask: "How large is it?" (e.g., inches or centimeters;  entire joint)      Less than a quarter size of redness 7. PAIN: "Is there pain?" If so, ask: "How bad is the pain?"    (e.g., Scale 1-10; or mild, moderate, severe)     No pain 8. TETANUS: For any breaks in the skin, ask: "When was the last tetanus booster?"     Due in October of this year 9. OTHER SYMPTOMS: "Do you have any other symptoms?"      No 10. PREGNANCY: "Is there any chance you are pregnant?" "When was your last menstrual period?"       No menstrual period stopped a couple of years ago  Answer Assessment - Initial Assessment Questions 1. APPEARANCE of RASH: "Describe the rash."      Area of  redness and then an area the pt states she is pretty sure is poison ivy. 2. LOCATION: "Where is the rash located?"      Right outer ankle 3. NUMBER: "How many spots are there?"      one 4. SIZE: "How big are the spots?" (Inches, centimeters or compare to size of a coin)      Area where drainage is coming from is the size of pencil eraser 5. ONSET: "When did the rash start?"      Sunday 6. ITCHING: "Does the rash itch?" If so, ask: "How bad is the itch?"  (Scale 1-10; or mild, moderate, severe)     moderate 7. PAIN: "Does the rash hurt?" If so, ask: "How bad is the pain?"  (Scale 1-10; or mild, moderate, severe)     Denies any pain 8. OTHER SYMPTOMS: "Do you have any other symptoms?" (e.g., fever)     No 9. PREGNANCY: "Is there any chance you are pregnant?" "When was your last menstrual period?"     No, no longer has menstrual periods  Protocols used: RASH OR REDNESS - LOCALIZED-A-AH, FOOT AND ANKLE INJURY-A-AH

## 2018-01-27 ENCOUNTER — Ambulatory Visit (INDEPENDENT_AMBULATORY_CARE_PROVIDER_SITE_OTHER): Payer: Managed Care, Other (non HMO) | Admitting: Family Medicine

## 2018-01-27 ENCOUNTER — Other Ambulatory Visit: Payer: Self-pay

## 2018-01-27 ENCOUNTER — Encounter: Payer: Self-pay | Admitting: Family Medicine

## 2018-01-27 VITALS — BP 110/72 | HR 80 | Temp 98.4°F | Resp 15 | Ht 68.0 in | Wt 171.4 lb

## 2018-01-27 DIAGNOSIS — L03119 Cellulitis of unspecified part of limb: Secondary | ICD-10-CM | POA: Diagnosis not present

## 2018-01-27 DIAGNOSIS — L309 Dermatitis, unspecified: Secondary | ICD-10-CM | POA: Diagnosis not present

## 2018-01-27 MED ORDER — TRIAMCINOLONE ACETONIDE 0.1 % EX OINT: 1.0000 "application " | TOPICAL_OINTMENT | Freq: Two times a day (BID) | CUTANEOUS | 1 refills | Status: DC

## 2018-01-27 MED ORDER — CEPHALEXIN 500 MG PO CAPS: 500.0000 mg | ORAL_CAPSULE | Freq: Three times a day (TID) | ORAL | 0 refills | Status: AC

## 2018-01-27 NOTE — Progress Notes (Signed)
   Subjective:    Patient ID: Hannah Kim, female    DOB: November 23, 1960, 57 y.o.   MRN: [id number]  HPI R ankle lesions- pt was gardening over the weekend and developed 2 itchy, circular lesions on lateral ankle.  Posterior lesion now has spreading redness, swelling.    Dry skin on R index finger- improves w/ vasoline and gloves overnight but will crack and sting- particularly after working on garden or washing dishes   Review of Systems For ROS see HPI     Objective:   Physical Exam  Constitutional: She appears well-developed and well-nourished. No distress.  HENT:  Head: Normocephalic and atraumatic.  Cardiovascular: Intact distal pulses.  Skin: Skin is warm and dry.  Dry, cracked and peeling skin along R index finger 2 erythematous, vesicular lesions on R ankle.  Posterior lesion has serous drainage and spreading erythema concerning for cellulitis.  + TTP  Vitals reviewed.         Assessment & Plan:  Eczema finger- new.  Apply Triamcinolone twice daily to improve sxs.  Pt expressed understanding and is in agreement w/ plan.   Cellulitis- new.  Unclear if this is gardening/thorn stick vs bug bites vs other.  Start Keflex.  Reviewed supportive care and red flags that should prompt return.  Pt expressed understanding and is in agreement w/ plan.

## 2018-01-27 NOTE — Patient Instructions (Signed)
Follow up as needed or as scheduled START the Keflex 3x/day w/ food Drink plenty of fluids Apply the triamcinolone ointment twice daily on the dry finger and the areas on the ankle Call with any questions or concerns- particularly if the areas worsen or change Have a great summer!!

## 2018-02-02 ENCOUNTER — Encounter: Payer: Self-pay | Admitting: Sports Medicine

## 2018-03-03 ENCOUNTER — Telehealth: Payer: Self-pay | Admitting: General Practice

## 2018-03-03 NOTE — Telephone Encounter (Signed)
Called pt to verify if she has received her second dose of Shingrix? If pt has not ok to schedule a nurse visit to receive second dose.  [facility] / Schedule patient.

## 2018-03-15 ENCOUNTER — Telehealth: Payer: Self-pay | Admitting: Family Medicine

## 2018-03-15 NOTE — Telephone Encounter (Signed)
Called pt and LMOVM asking if she could please advise of the date of the second shingles vaccination so I can update her chart. Also advised that for the measles vaccination we usually draw titers to see if pt is still immune to the vaccinations. If not then we may have to do a booster at that time.   Need to know if pt would like a appt to discuss with Dr. Beverely Lowabori or if she would like for me to get orders approved to have her get a lab draw

## 2018-03-15 NOTE — Telephone Encounter (Signed)
Ok for PEC to Discuss results / PCP recommendations / Schedule patient.   

## 2018-03-15 NOTE — Telephone Encounter (Signed)
Copied from CRM [id number]. Topic: Quick Communication - See Telephone Encounter >> Mar 15, 2018 10:06 AM Raquel SarnaHayes, Teresa G wrote: Not shingles shot.  Pt has had both already done. Pt wanting to discuss the measles shot.  Please call pt back to discuss.

## 2018-03-16 ENCOUNTER — Encounter: Payer: Self-pay | Admitting: Sports Medicine

## 2018-03-16 ENCOUNTER — Ambulatory Visit (INDEPENDENT_AMBULATORY_CARE_PROVIDER_SITE_OTHER): Payer: Managed Care, Other (non HMO) | Admitting: Sports Medicine

## 2018-03-16 VITALS — BP 106/60 | HR 78 | Ht 68.0 in | Wt 170.6 lb

## 2018-03-16 DIAGNOSIS — M25561 Pain in right knee: Secondary | ICD-10-CM

## 2018-03-16 DIAGNOSIS — M25562 Pain in left knee: Secondary | ICD-10-CM | POA: Diagnosis not present

## 2018-03-16 DIAGNOSIS — M79674 Pain in right toe(s): Secondary | ICD-10-CM

## 2018-03-16 DIAGNOSIS — M2021 Hallux rigidus, right foot: Secondary | ICD-10-CM | POA: Diagnosis not present

## 2018-03-16 DIAGNOSIS — R29898 Other symptoms and signs involving the musculoskeletal system: Secondary | ICD-10-CM | POA: Diagnosis not present

## 2018-03-16 DIAGNOSIS — G8929 Other chronic pain: Secondary | ICD-10-CM | POA: Diagnosis not present

## 2018-03-16 NOTE — Progress Notes (Signed)
Hannah FellsMichael D. Hannah Shinerigby, DO  [facility] [place] [phone]  Hannah KlinefelterLynn A Kim - 57 y.o. female MRN [id number]  Date of birth: [birth year]  Visit Date: 03/16/2018  PCP: Hannah Kim, Hannah E, MD   Referred by: Hannah Kim, Hannah E, MD  Scribe(s) for today's visit: Christoper FabianMolly Weber, LAT, ATC  SUBJECTIVE:  Hannah MedinaLynn A Kim is here for Follow-up (B hip and knee pain) .    12/07/2017: Her L knee pain symptoms INITIALLY: Began several months ago but has worsened over the past month. Described as moderate aching, , radiating to LE, hips, and lower back.  Worsened with weight bearing, bending, standing after sitting for prolonged periods of time.  Improved with rest. Additional associated symptoms include: She injured her R great toe in the past and always has stiffness in the foot. She stubbed her R great toe the end of last month which caused pain to worsen. She also has hx of injury to the R knee (meniscal injury, baker's cyst). She has been favoring the R leg since she stubbed her toe which seems to be causing the L knee pain. She has also noticed more pain in her lower back and hips. She has been leaning over more while doing yard work instead of kneeling d/t her knee pain.    At this time symptoms show no change compared to onset. The tightness around the L knee has subsided slightly. She has tried taking Tylenol with some relief but it cause GI upset. She also tried Aleve but it didn't help as much as the tylenol.  [facility]  01/18/2018: Compared to the last office visit, her previously described symptoms are improving, her knee is not bothering her at night anymore. She does still have occasional twinge of pain when walking. Pain is worse when standing or sitting for prolonged periods of time. She denies swelling.  Current symptoms are mild-moderate & are non-radiating. She reports that bilateral hip and  LBP have improved.  She has been doing HEP with no trouble. She has increased her reps to 15. She has not tried [employer] exercises. She is no longer taking Tylneol or Aleve.   She c/o continued burning and pain in her R foot.   03/16/2018: Compared to the last office visit on 01/18/18, her previously described B hip and knee pain symptoms are improving.  She notes increased pain in B knees w/ prolonged standing and also notices some swelling.  She reports some intermittent clicking in her B knees. Current symptoms are mild-mod depending on activity & are radiating to B lower legs occasionally. She has been doing her HEP 5-6x/week.  Her R great toe will intermittently bother her and get "stuck."  She does feel that the padding Dr. Berline Choughigby applied to her shoes at her last visit has helped.  She states that she is interested in getting orthotics but not at this visit.  R knee XR - 06/30/16 R foot XR - 03/25/16   REVIEW OF SYSTEMS: Denies night time disturbances. Denies fevers, chills, or night sweats. Denies unexplained weight loss. Denies personal history of cancer. Denies changes in bowel or bladder habits. Denies recent unreported falls. Denies new or worsening dyspnea or wheezing. Denies headaches or dizziness.  Denies numbness, tingling or weakness  In the extremities.  Denies dizziness or presyncopal episodes Denies lower extremity edema    HISTORY:  Prior history reviewed and updated per electronic medical  record.  Social History   Occupational History  . Occupation: Runner, broadcasting/film/video  Tobacco Use  . Smoking status: Never Smoker  . Smokeless tobacco: Never Used  Substance and Sexual Activity  . Alcohol use: Yes    Comment: 2/month  . Drug use: No  . Sexual activity: Yes   Social History   Social History Narrative  . Not on file     DATA OBTAINED & REVIEWED:  No results for input(s): HGBA1C, LABURIC, CREATINE in the last 8760 hours. .   OBJECTIVE:  VS:  HT:5\' 8"  (172.7  cm)   WT:170 lb 9.6 oz (77.4 kg)  BMI:25.95    BP:106/60  HR:78bpm  TEMP: ( )  RESP:97 %   PHYSICAL EXAM: CONSTITUTIONAL: Well-developed, Well-nourished and In no acute distress PSYCHIATRIC: Alert & appropriately interactive. and Not depressed or anxious appearing. RESPIRATORY: No increased work of breathing and Trachea Midline EYES: Pupils are equal., EOM intact without nystagmus. and No scleral icterus.  VASCULAR EXAM: Warm and well perfused NEURO: unremarkable  MSK Exam: Bilateral knees are well aligned with a small amount of osteoarthritic bossing but minimal.  No significant effusion.  Ligamentously stable. Hallux rigidus of the right foot with a Morton's foot.  She has pain with metatarsal squeeze test but significantly improved.  ASSESSMENT   1. Chronic pain of both knees   2. Weakness of both hips   3. Great toe pain, right   4. Hallux rigidus of right foot   5. Chronic pain of right knee     PLAN:  Pertinent additional documentation may be included in corresponding procedure notes, imaging studies, problem based documentation and patient instructions.  Procedures:  . None  Medications:  No orders of the defined types were placed in this encounter.  Discussion/Instructions: No problem-specific Assessment & Plan notes found for this encounter.  . Orthotic information provided today. . Home exercise program provided per handout . Discussed red flag symptoms that warrant earlier emergent evaluation and patient voices understanding. . Activity modifications and the importance of avoiding exacerbating activities (limiting pain to no more than a 4 / 10 during or following activity) recommended and discussed.  Follow-up:  . Return if symptoms worsen or fail to improve, for orthotics.  . If any lack of improvement consider: . repeat corticosteroid injections . She will follow-up for custom orthotics at her convenience.     CMA/ATC served as Neurosurgeon during this  visit. History, Physical, and Plan performed by medical provider. Documentation and orders reviewed and attested to.      Andrena Mews, DO    [facility] Physician

## 2018-03-16 NOTE — Patient Instructions (Addendum)
Look into having your insurance company cover a set of custom orthotics.  The code is L3030 and there are 2 units.  You can call them  and ask if this is covered.  I am happy to do these for you at any time, you just need to let our front office schedulers know you would like an "orthotic appointment."  Please also make sure you bring athletic shoes with you on the day of your orthotic appointment or whatever shoes you plan to wear your orthotics in most frequently.   Please perform the exercise program that we have prepared for you and gone over in detail on a daily basis.  In addition to the handout you were provided you can access your program through: [email or web address]   Your unique program code is: [email or web address]

## 2018-03-17 NOTE — Telephone Encounter (Signed)
Called pt again. Closing encounter until pt returns call.  

## 2018-03-23 ENCOUNTER — Encounter: Payer: Self-pay | Admitting: Sports Medicine

## 2018-03-23 ENCOUNTER — Ambulatory Visit (INDEPENDENT_AMBULATORY_CARE_PROVIDER_SITE_OTHER): Payer: Managed Care, Other (non HMO) | Admitting: Sports Medicine

## 2018-03-23 DIAGNOSIS — R269 Unspecified abnormalities of gait and mobility: Secondary | ICD-10-CM

## 2018-03-23 DIAGNOSIS — M79674 Pain in right toe(s): Secondary | ICD-10-CM | POA: Diagnosis not present

## 2018-03-23 DIAGNOSIS — M2021 Hallux rigidus, right foot: Secondary | ICD-10-CM | POA: Diagnosis not present

## 2018-03-23 NOTE — Progress Notes (Signed)
  Hannah FellsMichael D. Hannah Shinerigby, DO  [facility] [place] [phone]  Hannah KlinefelterLynn A Kim - 57 y.o. female MRN [id number]  Date of birth: [birth year]  Visit Date: 03/23/2018  PCP: Hannah Hatchabori, Katherine E, MD   Referred by: Hannah Hatchabori, Katherine E, MD      SUBJECTIVE:  Hannah Kim is here for Follow-up (orthotics)   03/23/18 Compared to the last office visit on 03/16/18, her previously described R great toe painsymptoms show no change.  She states that she does not need to f/u regarding her B hips and knees today but notes that she likes her new exercises and feels them working her muscles in different ways.  She is here today to have custom orthotics made.   ROS  HISTORY & PERTINENT PRIOR DATA:  Prior History reviewed and updated per electronic medical record.  OBJECTIVE:  PHYSICAL EXAM: Please see previous exam notes for full evaluation of foot and gait exam. Moderately high cavus foot with transverse arch breakdown and hallux rigidus.  No additional findings.   ASSESSMENT & PLAN:  No diagnosis found. PLAN: Custom orthotics fabricated today as below  PROCEDURE: CUSTOM ORTHOTIC FABRICATION Patient's underlying musculoskeletal conditions are directly related to poor biomechanics and will benefit from a functional custom orthotic.  There are no significant foot deformities that complicate the use of a custom orthotic.  The patient was fitted for a standard, cushioned, semi-rigid orthotic. The orthotic was heated & placed on the orthotic stand. The patient was positioned in subtalar neutral position and 10 of ankle dorsiflexion and weight bearing stance on the heated orthotic blank. After completion of the molding a base was applied to the orthotic blank. The orthotic was ground to a stable position for weightbearing. The patient ambulated in these and reported they were comfortable without pressure spots.              BLANK:  Size 10 - Standard  Cushioned                 BASE:  Blue EVA      POSTINGS:  N/a    Follow-up: No follow-ups on file.as previously scheduled  Pertinent documentation may be included in additional procedure notes, imaging studies, problem based documentation and patient instructions. Please see these sections of the encounter for additional information regarding this visit. CMA/ATC served as Neurosurgeonscribe during this visit. History, Physical, and Plan performed by medical provider. Documentation and orders reviewed and attested to.     Andrena MewsMichael D Rigby, DO    [facility] Physician

## 2018-04-27 ENCOUNTER — Encounter: Payer: Self-pay | Admitting: Sports Medicine

## 2018-04-27 NOTE — Progress Notes (Signed)
PROCEDURE NOTE: THERAPEUTIC EXERCISES (97110) 15 minutes spent for Therapeutic exercises as below and as referenced in the AVS.  This included exercises focusing on stretching, strengthening, with significant focus on eccentric aspects.   Proper technique shown and discussed handout in great detail with ATC.  All questions were discussed and answered.   Long term goals include an improvement in range of motion, strength, endurance as well as avoiding reinjury. Frequency of visits is one time as determined during today's  office visit. Frequency of exercises to be performed is as per handout.  EXERCISES REVIEWED:  Hip abduction strengthening,  Wall sits  Bridging

## 2018-05-25 ENCOUNTER — Telehealth: Payer: Self-pay | Admitting: Family Medicine

## 2018-05-25 DIAGNOSIS — Z0184 Encounter for antibody response examination: Secondary | ICD-10-CM

## 2018-05-25 NOTE — Telephone Encounter (Signed)
Copied from CRM [id number]. Topic: General - Other >> May 25, 2018  1:17 PM Harlan Stains wrote: Patient is calling in wanting to know if she needs to get the measles vaccination and also tetanus  CB# [phone]

## 2018-05-26 NOTE — Telephone Encounter (Signed)
I have left message for patient to call back.  CRM created so that [facility] can find out if patient wants blood draw for MMR.  If so, I will order.    Advised in CRM that it is okay for PEC to discuss/schedule patient.

## 2018-05-26 NOTE — Telephone Encounter (Signed)
Tetanus is due this month (Tdap) and we would need to do MMR titers to determine if she is in need of a measles booster.  This is what tells [place] if she is immune

## 2018-05-28 NOTE — Addendum Note (Signed)
Addended by: Lenis Dickinson on: 05/28/2018 10:12 AM   Modules accepted: Orders

## 2018-05-28 NOTE — Telephone Encounter (Signed)
Orders for MMR titer have been placed.

## 2018-05-28 NOTE — Telephone Encounter (Signed)
Pt has been schedule for mmr titer on 06-01-18 and flu shot. Pt must wait 30 day before getting tdap.. Please put order in for blood draw for MMR

## 2018-06-01 ENCOUNTER — Other Ambulatory Visit (INDEPENDENT_AMBULATORY_CARE_PROVIDER_SITE_OTHER): Payer: Managed Care, Other (non HMO)

## 2018-06-01 ENCOUNTER — Ambulatory Visit: Payer: Managed Care, Other (non HMO)

## 2018-06-01 DIAGNOSIS — Z23 Encounter for immunization: Secondary | ICD-10-CM | POA: Diagnosis not present

## 2018-06-01 DIAGNOSIS — Z0184 Encounter for antibody response examination: Secondary | ICD-10-CM | POA: Diagnosis not present

## 2018-06-02 LAB — MEASLES/MUMPS/RUBELLA IMMUNITY
Mumps IgG: <9 AU/mL — ABNORMAL LOW
Rubella: 2.67 index

## 2018-06-12 ENCOUNTER — Encounter: Payer: Self-pay | Admitting: Family Medicine

## 2018-06-12 ENCOUNTER — Ambulatory Visit (INDEPENDENT_AMBULATORY_CARE_PROVIDER_SITE_OTHER): Payer: Managed Care, Other (non HMO) | Admitting: Family Medicine

## 2018-06-12 VITALS — BP 118/74 | HR 76 | Temp 98.1°F | Wt 174.0 lb

## 2018-06-12 DIAGNOSIS — R21 Rash and other nonspecific skin eruption: Secondary | ICD-10-CM

## 2018-06-12 MED ORDER — DOXYCYCLINE HYCLATE 100 MG PO TABS: 100.0000 mg | ORAL_TABLET | Freq: Two times a day (BID) | ORAL | 0 refills | Status: DC

## 2018-06-12 MED ORDER — CEPHALEXIN 500 MG PO CAPS: 500.0000 mg | ORAL_CAPSULE | Freq: Three times a day (TID) | ORAL | 0 refills | Status: DC

## 2018-06-12 NOTE — Progress Notes (Signed)
Patient: Hannah Kim MRN: [id number] DOB: 1960/08/21 PCP: Hannah Hatch, MD     Subjective:  Chief Complaint  Patient presents with  . Insect Bite    On right shoulder about a week ago.     HPI: The patient is a 57 y.o. female who presents today for but bite on right shoulder that she first noticed one week ago. She did not see the bug, but thinks that is what happened. Monday or Tuesday she noticed a red lesion on her right collar bone. It didn't itch her and just ignored it. Last night she looked in the mirror and saw that the redness had spread down into a question mark form. It's not raised, warm to touch. No fever/chills. Again, it really doesn't itch her.   Review of Systems  Constitutional: Negative for chills and fever.  Respiratory: Negative for cough and shortness of breath.   Cardiovascular: Negative for chest pain and palpitations.  Gastrointestinal: Negative for abdominal pain, nausea and vomiting.  Skin: Positive for rash.    Allergies Patient is allergic to sulfa antibiotics.  Past Medical History Patient  has a past medical history of Allergy, GERD (gastroesophageal reflux disease), and History of chicken pox.  Surgical History Patient  has no past surgical history on file.  Family History Pateint's family history includes Arthritis in her mother; COPD in her father; Hearing loss in her mother; Hypertension in her father, sister, and sister; Scleroderma in her mother.  Social History Patient  reports that she has never smoked. She has never used smokeless tobacco. She reports that she drinks alcohol. She reports that she does not use drugs.    Objective: Vitals:   06/12/18 1046  BP: 118/74  Pulse: 76  Temp: 98.1 F (36.7 C)  TempSrc: Oral  SpO2: 98%  Weight: 174 lb (78.9 kg)    Body mass index is 26.46 kg/m.  Physical Exam  Constitutional: She appears well-developed and well-nourished.  Skin: Rash noted.  Erythematous rash on  her right collar bone in shape of upside down question mark. Slightly maculopapular. No vesicles or scaling.   Vitals reviewed.      Assessment/plan: 1. Rash Recommend she use her triamcinolone cream bid x 7-10 days. Looks more like a dermatitis of some kind. Will send in doxycyline to start if she feels like redness is spreading, becomes hot, etc. To cover for possible tick and cellulitis. If not better, f/u with pcp.      Return if symptoms worsen or fail to improve.    Orland Mustard, MD [facility]   06/12/2018

## 2018-06-12 NOTE — Patient Instructions (Addendum)
-  would put your steroid cream (triamcinolone) on rash twice a day for 7-10 days. Don't use on face -sending in doxycyline to start if spreading, warmth or signs of infection.

## 2018-07-06 ENCOUNTER — Ambulatory Visit: Payer: Managed Care, Other (non HMO)

## 2018-07-13 ENCOUNTER — Telehealth: Payer: Self-pay | Admitting: Emergency Medicine

## 2018-07-13 ENCOUNTER — Encounter: Payer: Self-pay | Admitting: Emergency Medicine

## 2018-07-13 ENCOUNTER — Ambulatory Visit (INDEPENDENT_AMBULATORY_CARE_PROVIDER_SITE_OTHER): Payer: Managed Care, Other (non HMO)

## 2018-07-13 DIAGNOSIS — Z23 Encounter for immunization: Secondary | ICD-10-CM

## 2018-07-13 NOTE — Telephone Encounter (Signed)
Spoke with Raiford Noble, PA-C since PCP is out of the office. [place] for patient to get Tdap updated while getting the MMR booster as well.  Copied from [place] [phone]. Topic: General - Other >> Jul 13, 2018 11:38 AM Yvette Rack wrote:  Reason for CRM: pt is wanting a TDAP please call pt to schedule Pt would like for this to be done today since she coming in later on this evening for her MMR

## 2018-07-13 NOTE — Progress Notes (Signed)
Administered MMR subq right arm and Tdap IM right arm. Pt tolerated well.

## 2018-09-14 ENCOUNTER — Ambulatory Visit: Payer: Self-pay | Admitting: *Deleted

## 2018-09-14 NOTE — Telephone Encounter (Signed)
See note

## 2018-09-14 NOTE — Telephone Encounter (Signed)
Noted  

## 2018-09-14 NOTE — Telephone Encounter (Signed)
Message from Hannah Kim sent at 09/14/2018 10:08 AM EST   Patient called to say that she is having an issue with her right knee. Said that she had therapy for for this in the past but was late for class and ran therefore irritating it and now there is some swelling and pain and when walking it feel like its going to give out. Patient also say that there may be fluid building up in back of this knee but she need to know if there is something she can do at home or does she need to be seen. Will be in class today between 12.30 to 3.30. Ph# [phone]    Called patient back regarding her right with pain and swelling. The last time she had problems with her knee, she did the leg exercises and used and orthotics . [place] and running when she was not supposed to has irritated that knee. Now feeling like fluid building up in the back of that knee. Appointment scheduled per protocol. Pt voiced understanding. 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   Reason for Disposition . [1] Painless swelling behind knee AND [2] bulges out when knee is straightened (extended)  Answer Assessment - Initial Assessment Questions 1. LOCATION: "Where is the swelling located?"  (e.g., left, right, both knees)    Right knee 2. SIZE and DESCRIPTION: "What does the swelling look like?"  (e.g., entire knee, localized)    Localized, slightly swollen 3. ONSET: "When did the swelling start?" "Does it come and go, or is it there all the time?"    yesterday 4. PAIN: "Is there any pain?" If so, ask: "How bad is it?" (Scale 1-10; or mild, moderate, severe)     Pain # quick 4 or 5 5. SETTING: "Has there been any recent work, exercise or other activity that involved that part of the body?"     Ran to an appointment 6. AGGRAVATING FACTORS: "What makes the knee swelling worse?" (e.g., walking, climbing stairs, running)    walking 7. ASSOCIATED SYMPTOMS: "Is there any pain or redness?"     pain 8. OTHER SYMPTOMS: "Do you have any other symptoms?" (e.g., chest pain, difficulty breathing, fever, calf pain)    no 9. PREGNANCY: "Is there any chance you are pregnant?" "When was your last menstrual period?"     LMP a few years  Protocols used: KNEE SWELLING-A-AH

## 2018-09-15 ENCOUNTER — Encounter: Payer: Self-pay | Admitting: Sports Medicine

## 2018-09-15 ENCOUNTER — Ambulatory Visit (INDEPENDENT_AMBULATORY_CARE_PROVIDER_SITE_OTHER): Payer: Managed Care, Other (non HMO) | Admitting: Sports Medicine

## 2018-09-15 ENCOUNTER — Ambulatory Visit: Payer: Self-pay

## 2018-09-15 VITALS — BP 106/70 | HR 81 | Ht 68.0 in | Wt 175.6 lb

## 2018-09-15 DIAGNOSIS — G8929 Other chronic pain: Secondary | ICD-10-CM

## 2018-09-15 DIAGNOSIS — M25461 Effusion, right knee: Secondary | ICD-10-CM

## 2018-09-15 DIAGNOSIS — M25561 Pain in right knee: Secondary | ICD-10-CM

## 2018-09-15 NOTE — Patient Instructions (Addendum)

## 2018-09-15 NOTE — Progress Notes (Signed)
Hannah Kim. [facility] [phone]  Hannah Kim - 58 y.o. female MRN [id number]  Date of birth: 1961/05/31  Visit Date: September 18, 2018  PCP: Sheliah Hatch, MD   Referred by: Sheliah Hatch, MD  SUBJECTIVE:  Chief Complaint  Patient presents with  . Follow-up    R knee pain.  Swelling. RICE    HPI: Patient is here for evaluation of right knee pain.  She had been doing well until she was trying to run between classes at work at [employer].  She has had progressive pain and swelling over the last 3 weeks since that time.  She is getting intermittent random sharp pains.  There is swelling and discomfort in the anterior and posterior aspect of the knee.  Pain does radiate into the right medial thigh.  Is worse with stairs and turning.  She is getting some clicking and popping.  REVIEW OF SYSTEMS: No significant nighttime awakenings due to this issue. Denies fevers, chills, recent weight gain or weight loss.  No night sweats.  Pt denies any change in bowel or bladder habits, muscle weakness, numbness or falls associated with this pain.  HISTORY:  Prior history reviewed and updated per electronic medical record.  Patient Active Problem List   Diagnosis Date Noted  . Right knee pain 09/15/2018    06/30/2016 XR R knee FINDINGS/IMPRESSION: BONES: The bones are mildly demineralized. No evident fracture. Lobular partially calcified lesion in the proximal right tibia may represent a cartilaginous lesion and demonstrates no cortical scalloping/destruction, large size, periosteal reaction, or other aggressive features. However, if pain can be attributed to this lesion, or if a soft tissue mass is associated, follow-up would be recommended. JOINTS: Joint spaces are well maintained. A trace amount of fluid is noted in the suprapatellar knee joint recess. SOFT TISSUES: No significant abnormalities.   . Physical  exam 09/02/2017  . Hyperlipidemia 09/02/2017  . 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 Occupation: Runner, broadcasting/film/video  Tobacco Use  . Smoking status: Never Smoker  . Smokeless tobacco: Never Used  Substance and Sexual Activity  . Alcohol use: Yes    Comment: 2/month  . Drug use: No  . Sexual activity: Yes   Social History   Social History Narrative  . Not on file    OBJECTIVE:  VS:  HT:5\' 8"  (172.7 cm)   WT:175 lb 9.6 oz (79.7 kg)  BMI:26.71    BP:106/70  HR:81bpm  TEMP: ( )  RESP:96 %   PHYSICAL EXAM: Adult female. No acute distress.  Alert and appropriate. Right knee is overall well aligned with moderate effusion.  She has tenderness along medial and lateral joint lines.  Slight pain with McMurray's but no reproducible click.  Some crepitation with patellar grind.   ASSESSMENT:  1. Chronic pain of right knee   2. Effusion of right knee     PROCEDURES:  US Guided Injection per procedure note       PLAN:  Pertinent additional documentation may be included in corresponding procedure notes, imaging studies, problem based documentation and patient instructions.  No problem-specific Assessment & Plan notes found for this encounter.   We will go ahead and inject the knee per procedure note and have them begin on hip and knee strenghtening exersises. Additionally we discussed the merits of compression and/or bracing and recommend prophylatic compression with activity.  Icing discussed PRN.  If persistent ongoing symptoms can consider repeat injections and viscous supplementation.  Continue previously prescribed home exercise program.   Continue with compression  Activity modifications and the importance of avoiding exacerbating activities (limiting pain to no more than a 4 / 10 during or following activity) recommended and discussed.  Discussed red flag symptoms that warrant earlier emergent evaluation and patient voices  understanding.  If any lack of improvement MRI will be indicated for consideration of surgical intervention.  Return in about 6 weeks (around 10/27/2018).          Andrena MewsMichael D Casey Fye, DO    [facility]

## 2018-09-15 NOTE — Procedures (Signed)
PROCEDURE NOTE:  Ultrasound Guided: Injection: Right knee Images were obtained and interpreted by myself, Gaspar Bidding, DO  Images have been saved and stored to PACS system. Images obtained on: GE S7 Ultrasound machine    ULTRASOUND FINDINGS:  Moderate synovitis with a small supraphysiologic effusion.  There is slight degenerative changes of the medial and lateral joint lines with bulging of the medial meniscus.  DESCRIPTION OF PROCEDURE:  The patient's clinical condition is marked by substantial pain and/or significant functional disability. Other conservative therapy has not provided relief, is contraindicated, or not appropriate. There is a reasonable likelihood that injection will significantly improve the patient's pain and/or functional impairment.   After discussing the risks, benefits and expected outcomes of the injection and all questions were reviewed and answered, the patient wished to undergo the above named procedure.  Verbal consent was obtained.  The ultrasound was used to identify the target structure and adjacent neurovascular structures. The skin was then prepped in sterile fashion and the target structure was injected under direct visualization using sterile technique as below:  Single injection performed as below: PREP: Alcohol and Ethel Chloride APPROACH:superiolateral, single injection, 25g 1.5 in. INJECTATE: 2 cc 0.5% Marcaine and 1 cc 40mg /mL DepoMedrol ASPIRATE: None DRESSING: Band-Aid  Post procedural instructions including recommending icing and warning signs for infection were reviewed.    This procedure was well tolerated and there were no complications.   IMPRESSION: Succesful Ultrasound Guided: Injection

## 2018-09-18 ENCOUNTER — Encounter: Payer: Self-pay | Admitting: Sports Medicine

## 2018-10-27 ENCOUNTER — Ambulatory Visit: Payer: Managed Care, Other (non HMO) | Admitting: Sports Medicine

## 2018-11-24 ENCOUNTER — Ambulatory Visit: Payer: Managed Care, Other (non HMO) | Admitting: Sports Medicine

## 2018-11-25 ENCOUNTER — Ambulatory Visit (INDEPENDENT_AMBULATORY_CARE_PROVIDER_SITE_OTHER): Payer: Managed Care, Other (non HMO) | Admitting: Sports Medicine

## 2018-11-25 ENCOUNTER — Encounter: Payer: Self-pay | Admitting: Sports Medicine

## 2018-11-25 VITALS — BP 123/72 | HR 72 | Ht 68.0 in | Wt 174.0 lb

## 2018-11-25 DIAGNOSIS — R269 Unspecified abnormalities of gait and mobility: Secondary | ICD-10-CM | POA: Diagnosis not present

## 2018-11-25 DIAGNOSIS — R29898 Other symptoms and signs involving the musculoskeletal system: Secondary | ICD-10-CM | POA: Diagnosis not present

## 2018-11-25 DIAGNOSIS — M25561 Pain in right knee: Secondary | ICD-10-CM | POA: Diagnosis not present

## 2018-11-25 DIAGNOSIS — G8929 Other chronic pain: Secondary | ICD-10-CM | POA: Diagnosis not present

## 2018-11-25 DIAGNOSIS — M25562 Pain in left knee: Secondary | ICD-10-CM

## 2018-11-25 NOTE — Progress Notes (Signed)
Veverly FellsMichael D. Delorise Shinerigby, DO  [facility] [phone]  Jearl KlinefelterLynn A Keeling - 58 y.o. female MRN [id number]  Date of birth: 07-14-1961  Visit Date: 11/25/2018  PCP: Sheliah Hatchabori, Katherine E, MD   Referred by: Sheliah Hatchabori, Katherine E, MD   Virtual Visit via Video  I connected with Deborra MedinaLynn A Burkey on 11/25/18 at  1:40 PM EDT by a video enabled telemedicine application and verified that I am speaking with the correct person using two identifiers. Location patient: Home Location provider: Provider office Persons participating in the virtual visit: Patient only  I discussed the limitations of evaluation and management by telemedicine and the availability of in person appointments. The patient expressed understanding and agreed to proceed.   SUBJECTIVE:   Chief Complaint  Patient presents with  . Follow-up    R knee pain. 2/1 Depo 40 injection- 09/15/18    HPI: Patient reports overall doing better.  She is no longer having any mechanical symptoms.  She is continuing to use her knee brace with the biking and walking that she has been doing.  She has only mild anterior posterior knee pain that is worsened towards the end of the day.  REVIEW OF SYSTEMS: No significant nighttime awakenings due to this issue. Denies fevers, chills, recent weight gain or weight loss.  No night sweats.  Pt denies any change in bowel or bladder habits, muscle weakness, numbness or falls associated with this pain.  HISTORY:  Prior history reviewed and updated per electronic medical record.  Patient Active Problem List   Diagnosis Date Noted  . Right knee pain 09/15/2018    06/30/2016 XR R knee FINDINGS/IMPRESSION: BONES: The bones are mildly demineralized. No evident fracture. Lobular partially calcified lesion in the proximal right tibia may represent a cartilaginous lesion and demonstrates no cortical scalloping/destruction, large size, periosteal reaction, or  other aggressive features. However, if pain can be attributed to this lesion, or if a soft tissue mass is associated, follow-up would be recommended. JOINTS: Joint spaces are well maintained. A trace amount of fluid is noted in the suprapatellar knee joint recess. SOFT TISSUES: No significant abnormalities.   . Physical exam 09/02/2017  . Hyperlipidemia 09/02/2017  . 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 Occupation: Runner, broadcasting/film/videoTeacher  Tobacco Use  . Smoking status: Never Smoker  . Smokeless tobacco: Never Used  Substance and Sexual Activity  . Alcohol use: Yes    Comment: 2/month  . Drug use: No  . Sexual activity: Yes   Social History   Social History Narrative  . Not on file     OBJECTIVE:  VS:  HT:5\' 8"  (172.7 cm)   WT:174 lb (78.9 kg)(Taken at home due to virtual visit)  BMI:26.46    BP:123/72  HR:72bpm  TEMP:(Not taken due to virtual visit)( )  RESP:(Not taken due to virtual visit)   PHYSICAL EXAM: GENERAL: Alert, appears well and in no acute distress. HEENT: Atraumatic, conjunctiva clear, no obvious abnormalities on inspection of external nose and ears. NECK: Normal movements of the head and neck. CARDIOPULMONARY: No increased WOB. Speaking in clear sentences. I:E ratio WNL.  MS: Moves all visible extremities without noticeable abnormality. PSYCH: Pleasant and cooperative, well-groomed. Speech normal rate and rhythm. Affect is appropriate. Insight and judgement are appropriate. Attention is focused, linear, and appropriate.  NEURO: CN grossly intact. Oriented as arrived to appointment on time with no prompting. Moves both UE equally.  SKIN:  No obvious lesions, wounds, erythema, or cyanosis noted on face or hands.    ASSESSMENT:   1. Chronic pain of right knee   2. Gait disturbance   3. Chronic pain of both knees   4. Weakness of both hips     PROCEDURES:  None  PLAN:  Pertinent additional documentation may be  included in corresponding procedure notes, imaging studies, problem based documentation and patient instructions.  No problem-specific Assessment & Plan notes found for this encounter.   Ultimately she is doing significantly better.  We will have her continue with the home therapeutic exercises, knee brace, intermittent icing and continue to increase her activities as tolerated.  She is looking to increase her physical activity in general and we discussed options for core conditioning and she will look into the foundations training program.  Home Therapeutic Exercises: Continue previously prescribed home exercise program  Activity modifications and the importance of avoiding exacerbating activities (limiting pain to no more than a 4 / 10 during or following activity) recommended and discussed.   Discussed red flag symptoms that warrant earlier emergent evaluation and patient voices understanding.   No orders of the defined types were placed in this encounter.  Lab Orders  No laboratory test(s) ordered today   Imaging Orders  No imaging studies ordered today   Referral Orders  No referral(s) requested today    Return if symptoms worsen or fail to improve.          Andrena Mews, DO    [facility] Physician

## 2018-11-25 NOTE — Patient Instructions (Addendum)
Also check out [employer]" which is a program developed by Dr. Myles Lipps.   There are links to a couple of his YouTube Videos below and I would like to see you performing one of his videos 5-6 days per week.  It is best to do these exercises first thing in the morning.  They will give you a good jumpstart here today and start normalizing the way you move.  Good intro videos: "Independence from Pain 7-minute Video" - [email or web address].[email or web address]      Introduction to [employer]     How to get relief with Sitting   A more advanced video is: Scientist, research (medical) original 12 minutes" - [email or web address]  Exercises that focus more on the neck are as below: Dr. Derrill Kay with [employer] teaching neck and shoulder details Part 1 - https://youtu.be/[email or web address] Part 2 Dr. Derrill Kay with [facility] quick routine to practice daily - https://youtu.be/[email or web address]  Do not try to attempt the entire video when first beginning.  Try breaking of each exercise that he goes into shorter segments.  In other words, if they perform an exercise for 45 seconds, start with 15 seconds and rest and then resume when they begin the new activity.  If you work your way up to being able to do these videos without having to stop, I expect you will see significant improvements in your pain.  If you enjoy his videos and would like to find out more you can look on his website: [email or web address].  He has a workout streaming option as well as a DVD set available for purchase.  Amazon has the best price for his DVDs.

## 2018-12-23 LAB — TSH: TSH: 2.5 (ref 0.41–5.90)

## 2018-12-23 LAB — HEPATIC FUNCTION PANEL
ALT: 15 (ref 7–35)
AST: 20 (ref 13–35)
Alkaline Phosphatase: 85 (ref 25–125)
Bilirubin, Total: 0.2

## 2018-12-23 LAB — BASIC METABOLIC PANEL
BUN: 22 — AB (ref 4–21)
Creatinine: 0.8 (ref 0.5–1.1)
Glucose: 93
Potassium: 4.9 (ref 3.4–5.3)
Sodium: 144 (ref 137–147)

## 2018-12-23 LAB — CBC AND DIFFERENTIAL
HCT: 40 (ref 36–46)
Hemoglobin: 14 (ref 12.0–16.0)
Neutrophils Absolute: 57
Platelets: 238 (ref 150–399)
WBC: 6.6

## 2018-12-23 LAB — VITAMIN D 25 HYDROXY (VIT D DEFICIENCY, FRACTURES): Vit D, 25-Hydroxy: 30.8

## 2018-12-23 LAB — HM PAP SMEAR

## 2018-12-23 LAB — HM MAMMOGRAPHY: HM Mammogram: NORMAL (ref 0–4)

## 2019-01-13 ENCOUNTER — Encounter: Payer: Self-pay | Admitting: General Practice

## 2019-02-23 ENCOUNTER — Encounter: Payer: Self-pay | Admitting: Family Medicine

## 2019-02-23 ENCOUNTER — Other Ambulatory Visit: Payer: Self-pay

## 2019-02-23 ENCOUNTER — Ambulatory Visit (INDEPENDENT_AMBULATORY_CARE_PROVIDER_SITE_OTHER): Payer: Managed Care, Other (non HMO) | Admitting: Family Medicine

## 2019-02-23 VITALS — BP 112/72 | HR 73 | Temp 97.9°F | Resp 16 | Ht 68.0 in | Wt 171.4 lb

## 2019-02-23 DIAGNOSIS — L989 Disorder of the skin and subcutaneous tissue, unspecified: Secondary | ICD-10-CM | POA: Diagnosis not present

## 2019-02-23 DIAGNOSIS — K219 Gastro-esophageal reflux disease without esophagitis: Secondary | ICD-10-CM | POA: Diagnosis not present

## 2019-02-23 DIAGNOSIS — Z0000 Encounter for general adult medical examination without abnormal findings: Secondary | ICD-10-CM

## 2019-02-23 DIAGNOSIS — E785 Hyperlipidemia, unspecified: Secondary | ICD-10-CM | POA: Diagnosis not present

## 2019-02-23 LAB — LIPID PANEL
Cholesterol: 181 mg/dL (ref 0–200)
HDL: 58.8 mg/dL (ref >39.00)
LDL Cholesterol: 110 mg/dL — ABNORMAL HIGH (ref 0–99)
NonHDL: 122.08
Total CHOL/HDL Ratio: 3
Triglycerides: 62 mg/dL (ref 0.0–149.0)
VLDL: 12.4 mg/dL (ref 0.0–40.0)

## 2019-02-23 NOTE — Assessment & Plan Note (Signed)
Chronic problem.  Attempting to control w/ diet and exercise.  Check labs and determine if medication is needed 

## 2019-02-23 NOTE — Progress Notes (Signed)
   Subjective:    Patient ID: Hannah Kim, female    DOB: [birth year], 58 y.o.   MRN: [id number]  HPI CPE- UTD on pap, mammo, colonoscopy, immunizations.     Review of Systems Patient reports no vision/ hearing changes, adenopathy,fever, weight change,  persistant/recurrent hoarseness , swallowing issues, chest pain, palpitations, edema, persistant/recurrent cough, hemoptysis, dyspnea (rest/exertional/paroxysmal nocturnal), gastrointestinal bleeding (melena, rectal bleeding), abdominal pain, bowel changes, GU symptoms (dysuria, hematuria, incontinence), Gyn symptoms (abnormal  bleeding, pain),  syncope, focal weakness, memory loss, numbness & tingling, hair/nail changes, abnormal bruising or bleeding, anxiety, or depression.   +GERD- chronic problem.  Working very hard on lifestyle modifications.  Not currently on medications.  Previously on Nexium- 'didn't like how it makes you feel'.  + hyperpigmentation of face, lesion on L forearm    Objective:   Physical Exam General Appearance:    Alert, cooperative, no distress, appears stated age  Head:    Normocephalic, without obvious abnormality, atraumatic  Eyes:    PERRL, conjunctiva/corneas clear, EOM's intact, fundi    benign, both eyes  Ears:    Normal TM's and external ear canals, both ears  Nose:   Deferred due to COVID  Throat:   Neck:   Supple, symmetrical, trachea midline, no adenopathy;    Thyroid: no enlargement/tenderness/nodules  Back:     Symmetric, no curvature, ROM normal, no CVA tenderness  Lungs:     Clear to auscultation bilaterally, respirations unlabored  Chest Wall:    No tenderness or deformity   Heart:    Regular rate and rhythm, S1 and S2 normal, no murmur, rub   or gallop  Breast Exam:    Deferred to GYN  Abdomen:     Soft, non-tender, bowel sounds active all four quadrants,    no masses, no organomegaly  Genitalia:    Deferred to GYN  Rectal:    Extremities:   Extremities normal, atraumatic, no  cyanosis or edema  Pulses:   2+ and symmetric all extremities  Skin:   R facial hyperpigmentation consistent w/ age spot, keratotic lesions of L forearm  Lymph nodes:   Cervical, supraclavicular, and axillary nodes normal  Neurologic:   CNII-XII intact, normal strength, sensation and reflexes    throughout          Assessment & Plan:

## 2019-02-23 NOTE — Assessment & Plan Note (Signed)
Pt's PE WNL.  UTD on pap, mammo, colonoscopy, immunizations.  Check labs.  Anticipatory guidance provided.  

## 2019-02-23 NOTE — Assessment & Plan Note (Signed)
New.  Pt does a good job of controlling w/ lifestyle modifications.  Will add Famotidine prn.  Pt expressed understanding and is in agreement w/ plan.

## 2019-02-23 NOTE — Patient Instructions (Signed)
Follow up in 1 year or as needed We'll notify you of your lab results and make any changes if needed Continue to work on healthy diet and regular exercise- you can do it! Consider adding Famotidine (Pepcid) for your reflux We'll call you with your Dermatology appt Call with any questions or concerns Stay Safe!!

## 2019-03-04 ENCOUNTER — Telehealth: Payer: Self-pay | Admitting: Family Medicine

## 2019-03-04 NOTE — Telephone Encounter (Signed)
Patient is calling because she did not receive any feedback after leaving a message on the website for the dermatologist that Dr. Birdie Riddle referred the patient to.  At this time, the patient is requesting a referral to the following practice.   [facility] [address] [place], [place] [address] [phone]

## 2019-03-04 NOTE — Telephone Encounter (Signed)
Referral has been updated and sent to [facility]. I have contacted patient to let her know.

## 2019-03-25 ENCOUNTER — Telehealth: Payer: Self-pay

## 2019-03-25 NOTE — Telephone Encounter (Signed)
We can certainly re-submit using the physical code.  However, some insurances don't pay anything at all for physical labs and they have to be associated w/ a diagnosis (such as hyperlipidemia).  If resubmitted, the bill may end up being higher.  Please ask her how she would like to proceed.

## 2019-03-25 NOTE — Telephone Encounter (Signed)
Representative from [employer] called in with patient stating that patient has received a $39 bill for blood work she had done on 02/23/19. Stated that because the dx. Code listed was hyperlipidemia, it will not be covered as preventative health. Wanted to know if there was anyway we can add on CPE as the primary dx?

## 2019-03-28 NOTE — Telephone Encounter (Signed)
HI,   If patient has hyperlipidemia, it is coded correct and no longer considered screening lab.  Thanks, [employer]

## 2019-03-28 NOTE — Telephone Encounter (Signed)
Dr. Birdie Riddle will need to correct chart and order if that is the case before I can have corrected. Thanks, [employer]

## 2019-03-28 NOTE — Telephone Encounter (Signed)
She does have a history of hyperlipidemia b/c in 2019 her total cholesterol was 210 and LDL was 126.  Both above goal.  While not high enough to start medication, this is the criteria for hyperlipidemia.  Again, we can associate this w/ her CPE, but this may not be covered at all rather than a $39 charge

## 2019-03-28 NOTE — Telephone Encounter (Signed)
Spoke with patient, stated that she has never had a diagnoses of hyperlipidemia, was considered borderline in the past. Is correctly not on any medication. Wanted to know if there was anyway to drop dx. Hyperlipidemia and just file solely as screening labs?

## 2019-03-29 NOTE — Telephone Encounter (Signed)
Spoke with pt this morning went over her past labs and advised she is listed as having hyperlipidemia due to abnormal/ elevated numbers. Pt said that she appreciated [place] looking into this and would like to keep everything the way it is and does not want to resubmit.

## 2019-11-03 DIAGNOSIS — Z23 Encounter for immunization: Secondary | ICD-10-CM | POA: Diagnosis not present

## 2019-11-24 DIAGNOSIS — Z23 Encounter for immunization: Secondary | ICD-10-CM | POA: Diagnosis not present

## 2019-11-30 ENCOUNTER — Ambulatory Visit: Payer: Managed Care, Other (non HMO)

## 2019-12-26 DIAGNOSIS — Z6825 Body mass index (BMI) 25.0-25.9, adult: Secondary | ICD-10-CM | POA: Diagnosis not present

## 2019-12-26 DIAGNOSIS — Z130 Encounter for screening for diseases of the blood and blood-forming organs and certain disorders involving the immune mechanism: Secondary | ICD-10-CM | POA: Diagnosis not present

## 2019-12-26 DIAGNOSIS — Z1389 Encounter for screening for other disorder: Secondary | ICD-10-CM | POA: Diagnosis not present

## 2019-12-26 DIAGNOSIS — Z0000 Encounter for general adult medical examination without abnormal findings: Secondary | ICD-10-CM | POA: Diagnosis not present

## 2019-12-26 DIAGNOSIS — Z01419 Encounter for gynecological examination (general) (routine) without abnormal findings: Secondary | ICD-10-CM | POA: Diagnosis not present

## 2020-01-10 DIAGNOSIS — Z1231 Encounter for screening mammogram for malignant neoplasm of breast: Secondary | ICD-10-CM | POA: Diagnosis not present

## 2020-02-27 ENCOUNTER — Other Ambulatory Visit: Payer: Self-pay

## 2020-02-27 ENCOUNTER — Ambulatory Visit (INDEPENDENT_AMBULATORY_CARE_PROVIDER_SITE_OTHER): Payer: BC Managed Care – PPO | Admitting: Family Medicine

## 2020-02-27 ENCOUNTER — Encounter: Payer: Self-pay | Admitting: Family Medicine

## 2020-02-27 VITALS — BP 128/86 | HR 79 | Temp 97.9°F | Resp 16 | Ht 68.0 in | Wt 164.0 lb

## 2020-02-27 DIAGNOSIS — Z0000 Encounter for general adult medical examination without abnormal findings: Secondary | ICD-10-CM

## 2020-02-27 DIAGNOSIS — M7989 Other specified soft tissue disorders: Secondary | ICD-10-CM

## 2020-02-27 NOTE — Progress Notes (Signed)
   Subjective:    Patient ID: Hannah Kim, female    DOB: May 04, 1961, 59 y.o.   MRN: [id number]  HPI CPE- UTD on GYN (Dr Senaida Ores), immunizations.  Had labs done 12/26/19- reviewed and WNL.  Pt is walking regularly- down 7 lbs since last visit.  Reviewed past medical, surgical, family and social histories.    Review of Systems Patient reports no vision/ hearing changes, adenopathy,fever, persistant/recurrent hoarseness , swallowing issues, chest pain, palpitations, edema, persistant/recurrent cough, hemoptysis, dyspnea (rest/exertional/paroxysmal nocturnal), gastrointestinal bleeding (melena, rectal bleeding), abdominal pain, significant heartburn, bowel changes, GU symptoms (dysuria, hematuria, incontinence), Gyn symptoms (abnormal  bleeding, pain),  syncope, focal weakness, memory loss, numbness & tingling, skin/hair/nail changes, abnormal bruising or bleeding, anxiety, or depression.   L upper arm soft tissue mass w/ overlying skin dimple  This visit occurred during the SARS-CoV-2 public health emergency.  Safety protocols were in place, including screening questions prior to the visit, additional usage of staff PPE, and extensive cleaning of exam room while observing appropriate contact time as indicated for disinfecting solutions.       Objective:   Physical Exam General Appearance:    Alert, cooperative, no distress, appears stated age  Head:    Normocephalic, without obvious abnormality, atraumatic  Eyes:    PERRL, conjunctiva/corneas clear, EOM's intact, fundi    benign, both eyes  Ears:    Normal TM's and external ear canals, both ears  Nose:  deferred due to COVID  Throat:   Neck:   Supple, symmetrical, trachea midline, no adenopathy;    Thyroid: no enlargement/tenderness/nodules  Back:     Symmetric, no curvature, ROM normal, no CVA tenderness  Lungs:     Clear to auscultation bilaterally, respirations unlabored  Chest Wall:    No tenderness or deformity   Heart:     Regular rate and rhythm, S1 and S2 normal, no murmur, rub   or gallop  Breast Exam:    Deferred to GYN  Abdomen:     Soft, non-tender, bowel sounds active all four quadrants,    no masses, no organomegaly  Genitalia:    Deferred to GYN  Rectal:    Extremities:   Extremities normal, atraumatic, no cyanosis or edema.  1" soft tissue mass of L upper arm w/ overlying skin dimple  Pulses:   2+ and symmetric all extremities  Skin:   Skin color, texture, turgor normal, no rashes or lesions.  Large vascular birth mark over L arm  Lymph nodes:   Cervical, supraclavicular, and axillary nodes normal  Neurologic:   CNII-XII intact, normal strength, sensation and reflexes    throughout          Assessment & Plan:  Soft tissue mass- new.  Suspect lipoma but given overlying skin dimple will get [place] to assess.  Pt expressed understanding and is in agreement w/ plan.

## 2020-02-27 NOTE — Patient Instructions (Addendum)
Follow up in 1 year or as needed No need to repeat labs!  They looked great! We'll call you with your ultrasound to assess that small area on your arm Keep up the good work on healthy diet and regular exercise- you look great! Call with any questions or concerns Have a great summer!

## 2020-02-27 NOTE — Assessment & Plan Note (Signed)
Pt's PE WNL w/ exception of soft tissue mass on L upper extremity.  UTD on GYN, colonoscopy, immunizations.  Reviewed recent labs from GYN- WNL.  Anticipatory guidance provided.

## 2020-02-28 ENCOUNTER — Ambulatory Visit
Admission: RE | Admit: 2020-02-28 | Discharge: 2020-02-28 | Disposition: A | Discharge status: Home / Self Care | Payer: BC Managed Care – PPO | Source: Ambulatory Visit | Attending: Family Medicine | Admitting: Family Medicine

## 2020-02-28 DIAGNOSIS — R2232 Localized swelling, mass and lump, left upper limb: Secondary | ICD-10-CM | POA: Diagnosis not present

## 2020-02-28 DIAGNOSIS — M7989 Other specified soft tissue disorders: Secondary | ICD-10-CM

## 2020-03-28 ENCOUNTER — Telehealth: Payer: Self-pay | Admitting: Family Medicine

## 2020-03-28 NOTE — Telephone Encounter (Signed)
Please advise 

## 2020-03-28 NOTE — Telephone Encounter (Signed)
Called and informed pt. She stated an understanding. Will call back to schedule appt if no relief or symptoms change.

## 2020-03-28 NOTE — Telephone Encounter (Signed)
Pt called stating Dr. Beverely Low looked in her ear at her physical and recommended she use OTC drops. Pt states she feels like the drops have not helped and her ear is still feeling blocked. Pt asked for CMA to give her a call. Please advise.

## 2020-03-28 NOTE — Telephone Encounter (Signed)
If wax- she was to use peroxide If popping and discomfort like when going up a mountain- nasal spray such as Flonase

## 2020-04-10 ENCOUNTER — Encounter: Payer: Self-pay | Admitting: Family Medicine

## 2020-04-10 ENCOUNTER — Ambulatory Visit (INDEPENDENT_AMBULATORY_CARE_PROVIDER_SITE_OTHER): Payer: BC Managed Care – PPO | Admitting: Family Medicine

## 2020-04-10 ENCOUNTER — Other Ambulatory Visit: Payer: Self-pay

## 2020-04-10 VITALS — BP 123/71 | HR 80 | Temp 97.6°F | Resp 16 | Ht 68.0 in | Wt 163.1 lb

## 2020-04-10 DIAGNOSIS — H6121 Impacted cerumen, right ear: Secondary | ICD-10-CM

## 2020-04-10 NOTE — Patient Instructions (Signed)
Follow up as needed or as scheduled Continue to use peroxide as needed to keep wax soft Call with any questions or concerns Stay Safe!  Stay Healthy!

## 2020-04-10 NOTE — Progress Notes (Signed)
   Subjective:    Patient ID: Hannah Kim, female    DOB: 06-18-1961, 59 y.o.   MRN: [id number]  HPI R ear wax- has been using peroxide at home w/ some improvement but she still feels that it is blocked.  Used ear plugs when mowing and feels that this pushed things in farther.  Would like to have ears cleaned out.   Review of Systems For ROS see HPI   This visit occurred during the SARS-CoV-2 public health emergency.  Safety protocols were in place, including screening questions prior to the visit, additional usage of staff PPE, and extensive cleaning of exam room while observing appropriate contact time as indicated for disinfecting solutions.       Objective:   Physical Exam Vitals reviewed.  Constitutional:      Appearance: Normal appearance.  HENT:     Head: Normocephalic and atraumatic.     Left Ear: Tympanic membrane and ear canal normal.     Ears:     Comments: R TM obscured by cerumen.  Pt did not tolerate use of a curette, was able to tolerate ear lavage w/o difficulty. Neurological:     Mental Status: She is alert.           Assessment & Plan:  Cerumen impaction- pt here today for wax removal.  She consented to removal first w/ curette but did not tolerate this.  She then consented for ear lavage.  She tolerated this w/o difficulty, there were no apparent complications, and she was pleased w/ the results.

## 2020-05-22 ENCOUNTER — Ambulatory Visit (INDEPENDENT_AMBULATORY_CARE_PROVIDER_SITE_OTHER): Payer: BC Managed Care – PPO

## 2020-05-22 ENCOUNTER — Other Ambulatory Visit: Payer: Self-pay

## 2020-05-22 DIAGNOSIS — Z23 Encounter for immunization: Secondary | ICD-10-CM

## 2020-11-08 ENCOUNTER — Encounter: Payer: Self-pay | Admitting: Family Medicine

## 2020-11-08 NOTE — Telephone Encounter (Signed)
Pt requesting referral or recommendation for acupuncturist to treat GERD? Any advice?

## 2020-11-16 ENCOUNTER — Ambulatory Visit (INDEPENDENT_AMBULATORY_CARE_PROVIDER_SITE_OTHER): Payer: BC Managed Care – PPO | Admitting: Physician Assistant

## 2020-11-16 ENCOUNTER — Other Ambulatory Visit: Payer: Self-pay

## 2020-11-16 ENCOUNTER — Telehealth: Payer: Self-pay | Admitting: Family Medicine

## 2020-11-16 ENCOUNTER — Encounter: Payer: Self-pay | Admitting: Physician Assistant

## 2020-11-16 VITALS — BP 127/74 | HR 69 | Temp 97.2°F | Ht 68.0 in | Wt 169.2 lb

## 2020-11-16 DIAGNOSIS — R42 Dizziness and giddiness: Secondary | ICD-10-CM

## 2020-11-16 DIAGNOSIS — R1110 Vomiting, unspecified: Secondary | ICD-10-CM

## 2020-11-16 LAB — POC URINALSYSI DIPSTICK (AUTOMATED)
Bilirubin, UA: NEGATIVE
Blood, UA: NEGATIVE
Glucose, UA: NEGATIVE
Ketones, UA: NEGATIVE
Leukocytes, UA: NEGATIVE
Nitrite, UA: NEGATIVE
Protein, UA: POSITIVE — AB
Spec Grav, UA: 1.025 (ref 1.010–1.025)
Urobilinogen, UA: 0.2 E.U./dL
pH, UA: 6.5 (ref 5.0–8.0)

## 2020-11-16 NOTE — Patient Instructions (Signed)
It was great to see you!  I suspect vertigo.   I'm going to put in a referral for vestibular rehab with a physical therapist.  Get help right away if:  Your headache gets very bad quickly.  Your headache gets worse after a lot of physical activity.  You keep throwing up.  You have a stiff neck.  You have trouble seeing.  You have trouble speaking.  You have pain in the eye or ear.  Your muscles are weak or you lose muscle control.  You lose your balance or have trouble walking.  You feel like you will pass out (faint) or you pass out.  You are mixed up (confused).  You have a seizure.  Take care,  Jarold Motto PA-C

## 2020-11-16 NOTE — Telephone Encounter (Signed)
Patient Name: Hannah Kim [facility] Gender: Female DOB: 1960/10/03 Age: 60 Y 4 M 12 D Return Phone Number: [phone] (Primary) Address: City/ State/ Zip: [place] [address] Client [facility] - [place] Client Site [facility] - Day Physician Lezlie Octave- MD Contact Type Call Who Is Calling Patient / Member / Family / Caregiver Call Type Triage / Clinical Relationship To Patient Self Return Phone Number [phone] (Primary) Chief Complaint Vomiting Reason for Call Symptomatic / Request for Health Information Initial Comment Has abdominal pain, dizziness and vomiting. Translation No Nurse Assessment Nurse: Self, RN, Herbert Seta Date/Time (Eastern Time): 11/16/2020 9:50:13 AM Confirm and document reason for call. If symptomatic, describe symptoms. ---Caller says ABD pain is gone now , some dizziness and Vomiting yesterday. Does the patient have any new or worsening symptoms? ---Yes Will a triage be completed? ---Yes Related visit to physician within the last 2 weeks? ---No Does the PT have any chronic conditions? (i.e. diabetes, asthma, this includes High risk factors for pregnancy, etc.) ---No Is this a behavioral health or substance abuse call? ---No Guidelines Guideline Title Affirmed Question Affirmed Notes Nurse Date/Time (Eastern Time) Dizziness - Lightheadedness [1] MODERATE dizziness (e.g., interferes with normal activities) AND [2] has NOT been evaluated by physician for this (Exception: dizziness caused by heat exposure, sudden standing, or poor fluid intake) Self, RN, Herbert Seta 11/16/2020 9:52:27 AM PLEASE NOTE: All timestamps contained within this report are represented as [place] Standard Time. CONFIDENTIALTY NOTICE: This fax transmission is intended only for the addressee. It contains information that is legally privileged, confidential or otherwise protected from use or disclosure. If you  are not the intended recipient, you are strictly prohibited from reviewing, disclosing, copying using or disseminating any of this information or taking any action in reliance on or regarding this information. If you have received this fax in error, please notify us immediately by telephone so that we can arrange for its return to [place]. Phone: [phone], Toll-Free: [phone], Fax: [phone] Page: 2 of 2 Call Id: [id number] Disp. Time Lamount Cohen Time) Disposition Final User 11/16/2020 9:54:21 AM See PCP within 24 Hours Yes Self, RN, Herbert Seta Caller Disagree/Comply Comply Caller Understands Yes PreDisposition Call Doctor Care Advice Given Per Guideline * IF OFFICE WILL BE OPEN: You need to be examined within the next 24 hours. Call your doctor (or NP/PA) when the office opens and make an appointment. SEE PCP WITHIN 24 HOURS: DRINK FLUIDS: * Drink several glasses of fruit juice, other clear fluids or water. CALL BACK IF: * You become worse * Passes out (faints) CARE ADVICE given per Dizziness (Adult) guideline. LIE DOWN AND REST: * Lie down with feet elevated for 1 hour. Referrals REFERRED TO PCP OFFICE

## 2020-11-16 NOTE — Progress Notes (Signed)
Hannah Kim is a 60 y.o. female here for a recurrence of a previously resolved problem.  History of Present Illness:   Chief Complaint  Patient presents with  . Dizziness    When turning to rightside    . Emesis  . Abdominal Pain    HPI  Dizziness When she woke up this morning, she had vertigo with head movements. She has had vertigo in the past that responded well to the Epley Maneuver.  She tried this at home today. She has GERD at baseline, and had 3 epsiodes of vomiting after Epley Maneuver. Had normal BM this morning and had a little abdominal pain. Symptoms have pretty much resolved since taking nap this afternoon. She has eaten a banana and open faced tuna sandwich this afternoon without any n/v.   Denies: double vision, HA, slurred speech, head trauma, weakness on one side of the body  Past Medical History:  Diagnosis Date  . Allergy   . GERD (gastroesophageal reflux disease)   . History of chicken pox      Social History   Tobacco Use  . Smoking status: Never Smoker  . Smokeless tobacco: Never Used  Vaping Use  . Vaping Use: Never used  Substance Use Topics  . Alcohol use: Yes    Comment: 2/month  . Drug use: No    History reviewed. No pertinent surgical history.  Family History  Problem Relation Age of Onset  . Arthritis Mother   . Hearing loss Mother   . Scleroderma Mother   . COPD Father   . Hypertension Father   . Hypertension Sister   . Hypertension Sister     Allergies  Allergen Reactions  . Sulfa Antibiotics Rash    Current Medications:   Current Outpatient Medications:  .  Calcium-Magnesium-Vitamin D (CALCIUM 1200+D3 PO), Take by mouth., Disp: , Rfl:  .  Multiple Vitamin (MULTIVITAMIN) capsule, Take 1 capsule by mouth daily., Disp: , Rfl:  .  Probiotic Product (PROBIOTIC-10 PO), Take by mouth., Disp: , Rfl:    Review of Systems:   ROS  Vitals:   Vitals:   11/16/20 1526  BP: 127/74  Pulse: 69  Temp: (!) 97.2 F (36.2  C)  SpO2: 98%  Weight: 169 lb 3.2 oz (76.7 kg)  Height: 5\' 8"  (1.727 m)     Body mass index is 25.73 kg/m.  Physical Exam:   Physical Exam Vitals and nursing note reviewed.  Constitutional:      General: She is not in acute distress.    Appearance: She is well-developed. She is not ill-appearing or toxic-appearing.  Cardiovascular:     Rate and Rhythm: Normal rate and regular rhythm.     Pulses: Normal pulses.     Heart sounds: Normal heart sounds, S1 normal and S2 normal.     Comments: No LE edema Pulmonary:     Effort: Pulmonary effort is normal.     Breath sounds: Normal breath sounds.  Abdominal:     General: Abdomen is flat. Bowel sounds are normal.     Palpations: Abdomen is soft.     Tenderness: There is no abdominal tenderness.  Skin:    General: Skin is warm and dry.  Neurological:     General: No focal deficit present.     Mental Status: She is alert.     GCS: GCS eye subscore is 4. GCS verbal subscore is 5. GCS motor subscore is 6.     Cranial Nerves: Cranial nerves  are intact.     Sensory: Sensation is intact.     Motor: Motor function is intact.     Coordination: Coordination is intact.     Gait: Gait is intact.  Psychiatric:        Speech: Speech normal.        Behavior: Behavior normal. Behavior is cooperative.     Results for orders placed or performed in visit on 11/16/20  POCT Urinalysis Dipstick (Automated)  Result Value Ref Range   Color, UA yellow    Clarity, UA hazy    Glucose, UA Negative Negative   Bilirubin, UA negative    Ketones, UA negative    Spec Grav, UA 1.025 1.010 - 1.025   Blood, UA negative    pH, UA 6.5 5.0 - 8.0   Protein, UA Positive (A) Negative   Urobilinogen, UA 0.2 0.2 or 1.0 E.U./dL   Nitrite, UA negative    Leukocytes, UA Negative Negative    Assessment and Plan:   [name] was seen today for dizziness, emesis and abdominal pain.  Diagnoses and all orders for this visit:  Vomiting, intractability of vomiting  not specified, presence of nausea not specified, unspecified vomiting type; Vertigo UA negative. Symptoms have essentially resolved. Symptoms consistent with past BPPV. I have put in referral for vestibular rehab to further evaluate. Worsening precautions advised in the interim, low threshold to go to ER if any new/worsening sx over weekend. -     POCT Urinalysis Dipstick (Automated)  Jarold Motto, PA-C

## 2020-11-26 ENCOUNTER — Other Ambulatory Visit: Payer: Self-pay | Admitting: Physician Assistant

## 2020-11-26 DIAGNOSIS — R42 Dizziness and giddiness: Secondary | ICD-10-CM

## 2020-11-26 DIAGNOSIS — R1110 Vomiting, unspecified: Secondary | ICD-10-CM

## 2020-12-06 ENCOUNTER — Other Ambulatory Visit: Payer: Self-pay

## 2020-12-06 ENCOUNTER — Ambulatory Visit: Payer: BC Managed Care – PPO | Attending: Physician Assistant | Admitting: Physical Therapy

## 2020-12-06 DIAGNOSIS — H8112 Benign paroxysmal vertigo, left ear: Secondary | ICD-10-CM | POA: Diagnosis not present

## 2020-12-06 NOTE — Patient Instructions (Addendum)
Feet Together (Compliant Surface) Head Motion - Eyes Closed    Stand on compliant surface: _pillows_______ with feet together. Close eyes and move head slowly, up and down. Repeat __1__ times per session. Do _1___ sessions per day.  Head turns 5-10 reps horizontal and vertical    Tandem Stance    Right foot in front of left, heel touching toe both feet "straight ahead". Stand on Foot Triangle of Support with both feet. Balance in this position _30__ seconds. Do with left foot in front of right. Hold 30 secs.  Can start with partial heel to toe and progress to tandem   SINGLE LIMB STANCE    Stance: single leg on floor. Raise leg. Hold _10-15__ seconds. Repeat with other leg. __2_ reps per set, _2__ sets per day, __5_ days per week  Copyright  VHI. All rights reserved.    Self Treatment for Right Posterior / Anterior Canalithiasis    Sitting on bed: 1. Turn head 45 right. (a) Lie back slowly, shoulders on pillow, head on bed. (b) Hold ____ seconds. 2. Keeping head on bed, turn head 90 left. Hold ____ seconds. 3. Roll to left, head on 45 angle down toward bed. Hold ____ seconds. 4. Sit up on left side of bed. Repeat ____ times per session. Do ____ sessions per day.  Copyright  VHI. All rights reserved.

## 2020-12-07 ENCOUNTER — Encounter: Payer: Self-pay | Admitting: Physical Therapy

## 2020-12-07 NOTE — Therapy (Signed)
[facility] [address] [place], [place], [address] Phone: [phone]   Fax:  [phone]  Physical Therapy Evaluation  Patient Details  Name: Hannah Kim MRN: [id number] Date of Birth: [birth year] Referring Provider (PT): Jarold Motto, [place]   Encounter Date: 12/06/2020   PT End of Session - 12/07/20 1722    Visit Number 1    Authorization Type BCBS    PT Start Time 0935    PT Stop Time 1015    PT Time Calculation (min) 40 min    Activity Tolerance Patient tolerated treatment well    Behavior During Therapy [facility] for tasks assessed/performed           Past Medical History:  Diagnosis Date  . Allergy   . GERD (gastroesophageal reflux disease)   . History of chicken pox     History reviewed. No pertinent surgical history.  There were no vitals filed for this visit.    Subjective Assessment - 12/06/20 0941    Subjective Pt reports episodic vertigo for many years ago (had it 10 yrs ago) with most recent episode on 11-15-20; pt states she self treated with Epley maneuver for Rt sided; pt states she feels swimmy headed at times; feels "head has to readjust" when she leans forward and returns to upright; pt states she is more aware of it now than she used to be    Patient Stated Goals to learn how to treat the BPPV if it should happen again    Currently in Pain? No/denies              [facility] PT Assessment - 12/07/20 0001      Assessment   Medical Diagnosis Vertigo    Referring Provider (PT) Jarold Motto, PA    Onset Date/Surgical Date 11/15/20    Prior Therapy None      Precautions   Precautions None      Balance Screen   Has the patient fallen in the past 6 months No    Has the patient had a decrease in activity level because of a fear of falling?  No    Is the patient reluctant to leave their home because of a fear of falling?  No      Prior Function   Level of Independence Independent                   Vestibular Assessment - 12/07/20 0001      Symptom Behavior   Type of Dizziness  Spinning    Frequency of Dizziness varies depending on head position    Duration of Dizziness seconds to minutes    Symptom Nature Positional    Aggravating Factors Rolling to left;Forward bending    Progression of Symptoms Better      Oculomotor Exam   Oculomotor Alignment Normal    Spontaneous Absent      Positional Testing   Dix-Hallpike Dix-Hallpike Right;Dix-Hallpike Left    Sidelying Test Sidelying Right;Sidelying Left      Dix-Hallpike Right   Dix-Hallpike Right Duration none    Dix-Hallpike Right Symptoms No nystagmus      Dix-Hallpike Left   Dix-Hallpike Left Duration none    Dix-Hallpike Left Symptoms No nystagmus      Sidelying Right   Sidelying Right Duration none    Sidelying Right Symptoms No nystagmus      Sidelying Left   Sidelying Left Duration none    Sidelying Left Symptoms No  nystagmus              Objective measurements completed on examination: See above findings.               PT Education - 12/07/20 1721    Education Details balance exs; BPPV etiology and self treatment (Epley)    Person(s) Educated Patient    Methods Explanation;Demonstration;Handout    Comprehension Verbalized understanding;Returned demonstration                       Plan - 12/07/20 1722    Clinical Impression Statement Pt has symptoms consistent with Lt BPPV posterior canalithiasis which has currently resolved.  Pt was instructed in Epley maneuver for self treatment should BPPV re-occur in the future.  No PT warranted at this time due to no c/o vertigo.  All positional testing negative.    Examination-Activity Limitations Locomotion Level    Examination-Participation Restrictions Cleaning;Laundry;Yard Work    Stability/Clinical Decision Making Stable/Uncomplicated    Optometrist Low    Rehab Potential Good    PT Frequency  One time visit    PT Treatment/Interventions Patient/family education;ADLs/Self Care Home Management    PT Next Visit Plan N/A - eval only           Patient will benefit from skilled therapeutic intervention in order to improve the following deficits and impairments:  Dizziness  Visit Diagnosis: BPPV (benign paroxysmal positional vertigo), left - Plan: PT plan of care cert/re-cert     Problem List Patient Active Problem List   Diagnosis Date Noted  . GERD (gastroesophageal reflux disease) 02/23/2019  . Right knee pain 09/15/2018  . Physical exam 09/02/2017  . Hyperlipidemia 09/02/2017  . 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

## 2021-02-06 ENCOUNTER — Encounter: Payer: Self-pay | Admitting: *Deleted

## 2021-02-15 ENCOUNTER — Encounter: Payer: Self-pay | Admitting: Family Medicine

## 2021-03-01 ENCOUNTER — Encounter: Payer: BC Managed Care – PPO | Admitting: Family Medicine

## 2021-04-01 DIAGNOSIS — Z01419 Encounter for gynecological examination (general) (routine) without abnormal findings: Secondary | ICD-10-CM | POA: Diagnosis not present

## 2021-04-01 DIAGNOSIS — Z124 Encounter for screening for malignant neoplasm of cervix: Secondary | ICD-10-CM | POA: Diagnosis not present

## 2021-04-01 DIAGNOSIS — Z1231 Encounter for screening mammogram for malignant neoplasm of breast: Secondary | ICD-10-CM | POA: Diagnosis not present

## 2021-04-01 DIAGNOSIS — Z6826 Body mass index (BMI) 26.0-26.9, adult: Secondary | ICD-10-CM | POA: Diagnosis not present

## 2021-04-01 DIAGNOSIS — Z1151 Encounter for screening for human papillomavirus (HPV): Secondary | ICD-10-CM | POA: Diagnosis not present

## 2021-04-01 LAB — HM PAP SMEAR

## 2021-04-16 ENCOUNTER — Other Ambulatory Visit: Payer: Self-pay | Admitting: Obstetrics and Gynecology

## 2021-04-16 DIAGNOSIS — R928 Other abnormal and inconclusive findings on diagnostic imaging of breast: Secondary | ICD-10-CM

## 2021-05-07 ENCOUNTER — Other Ambulatory Visit: Payer: Self-pay

## 2021-05-07 ENCOUNTER — Ambulatory Visit
Admission: RE | Admit: 2021-05-07 | Discharge: 2021-05-07 | Disposition: A | Discharge status: Home / Self Care | Payer: BC Managed Care – PPO | Source: Ambulatory Visit | Attending: Obstetrics and Gynecology | Admitting: Obstetrics and Gynecology

## 2021-05-07 ENCOUNTER — Other Ambulatory Visit: Payer: Self-pay | Admitting: Obstetrics and Gynecology

## 2021-05-07 DIAGNOSIS — R928 Other abnormal and inconclusive findings on diagnostic imaging of breast: Secondary | ICD-10-CM

## 2021-05-07 DIAGNOSIS — R922 Inconclusive mammogram: Secondary | ICD-10-CM | POA: Diagnosis not present

## 2021-05-29 ENCOUNTER — Encounter: Payer: Self-pay | Admitting: Family Medicine

## 2021-05-29 ENCOUNTER — Ambulatory Visit (INDEPENDENT_AMBULATORY_CARE_PROVIDER_SITE_OTHER): Payer: BC Managed Care – PPO | Admitting: Family Medicine

## 2021-05-29 ENCOUNTER — Other Ambulatory Visit: Payer: Self-pay

## 2021-05-29 VITALS — BP 122/78 | HR 63 | Temp 97.8°F | Resp 18 | Ht 67.9 in | Wt 171.4 lb

## 2021-05-29 DIAGNOSIS — Z23 Encounter for immunization: Secondary | ICD-10-CM | POA: Diagnosis not present

## 2021-05-29 DIAGNOSIS — Z0000 Encounter for general adult medical examination without abnormal findings: Secondary | ICD-10-CM

## 2021-05-29 DIAGNOSIS — E785 Hyperlipidemia, unspecified: Secondary | ICD-10-CM | POA: Diagnosis not present

## 2021-05-29 LAB — BASIC METABOLIC PANEL
BUN: 16 mg/dL (ref 6–23)
CO2: 26 mEq/L (ref 19–32)
Calcium: 9.4 mg/dL (ref 8.4–10.5)
Chloride: 105 mEq/L (ref 96–112)
Creatinine, Ser: 0.74 mg/dL (ref 0.40–1.20)
GFR: 88.89 mL/min (ref >60.00)
Glucose, Bld: 75 mg/dL (ref 70–99)
Potassium: 4 mEq/L (ref 3.5–5.1)
Sodium: 140 mEq/L (ref 135–145)

## 2021-05-29 LAB — HEPATIC FUNCTION PANEL
ALT: 15 U/L (ref 0–35)
AST: 22 U/L (ref 0–37)
Albumin: 4.5 g/dL (ref 3.5–5.2)
Alkaline Phosphatase: 64 U/L (ref 39–117)
Bilirubin, Direct: 0.1 mg/dL (ref 0.0–0.3)
Total Bilirubin: 0.5 mg/dL (ref 0.2–1.2)
Total Protein: 7.4 g/dL (ref 6.0–8.3)

## 2021-05-29 LAB — CBC WITH DIFFERENTIAL/PLATELET
Basophils Absolute: 0.1 10*3/uL (ref 0.0–0.1)
Basophils Relative: 1.3 % (ref 0.0–3.0)
Eosinophils Absolute: 0.2 10*3/uL (ref 0.0–0.7)
Eosinophils Relative: 3.9 % (ref 0.0–5.0)
HCT: 38.9 % (ref 36.0–46.0)
Hemoglobin: 12.9 g/dL (ref 12.0–15.0)
Lymphocytes Relative: 28.3 % (ref 12.0–46.0)
Lymphs Abs: 1.4 10*3/uL (ref 0.7–4.0)
MCHC: 33.2 g/dL (ref 30.0–36.0)
MCV: 91.1 fl (ref 78.0–100.0)
Monocytes Absolute: 0.4 10*3/uL (ref 0.1–1.0)
Monocytes Relative: 7.8 % (ref 3.0–12.0)
Neutro Abs: 2.9 10*3/uL (ref 1.4–7.7)
Neutrophils Relative %: 58.7 % (ref 43.0–77.0)
Platelets: 212 10*3/uL (ref 150.0–400.0)
RBC: 4.28 Mil/uL (ref 3.87–5.11)
RDW: 13.6 % (ref 11.5–15.5)
WBC: 4.9 10*3/uL (ref 4.0–10.5)

## 2021-05-29 LAB — LIPID PANEL
Cholesterol: 212 mg/dL — ABNORMAL HIGH (ref 0–200)
HDL: 73.5 mg/dL (ref >39.00)
LDL Cholesterol: 128 mg/dL — ABNORMAL HIGH (ref 0–99)
NonHDL: 138.02
Total CHOL/HDL Ratio: 3
Triglycerides: 49 mg/dL (ref 0.0–149.0)
VLDL: 9.8 mg/dL (ref 0.0–40.0)

## 2021-05-29 LAB — TSH: TSH: 4.23 u[IU]/mL (ref 0.35–5.50)

## 2021-05-29 NOTE — Assessment & Plan Note (Signed)
Pt's PE WNL.  UTD on pap, mammo, colonoscopy, Tdap, shingles.  Flu shot given today.  Check labs.  Anticipatory guidance provided.

## 2021-05-29 NOTE — Assessment & Plan Note (Signed)
Attempting to control w/ diet and exercise.  Check labs and determine if meds are needed ?

## 2021-05-29 NOTE — Progress Notes (Signed)
   Subjective:    Patient ID: Hannah Kim, female    DOB: 11/05/1960, 61 y.o.   MRN: [id number]  HPI CPE- UTD on pap, mammo, colonoscopy.  UTD on Tdap, COVID, shingles.  Will get flu shot today.  Health Maintenance  Topic Date Due   Zoster Vaccines- Shingrix (2 of 2) 04/01/2017   COVID-19 Vaccine (3 - Booster for Pfizer series) 04/25/2020   MAMMOGRAM  12/22/2020   INFLUENZA VACCINE  03/11/2021   Hepatitis C Screening  05/29/2022 (Originally 07/07/1980)   HIV Screening  05/29/2022 (Originally 07/07/1977)   PAP SMEAR-Modifier  04/01/2024   COLONOSCOPY (Pts 45-55yrs Insurance coverage will need to be confirmed)  09/02/2024   TETANUS/TDAP  07/13/2028   HPV VACCINES  Aged Out     Review of Systems Patient reports no vision/ hearing changes, adenopathy,fever, weight change,  persistant/recurrent hoarseness , swallowing issues, chest pain, palpitations, edema, persistant/recurrent cough, hemoptysis, dyspnea (rest/exertional/paroxysmal nocturnal), gastrointestinal bleeding (melena, rectal bleeding), abdominal pain, significant heartburn, bowel changes, GU symptoms (dysuria, hematuria, incontinence), Gyn symptoms (abnormal  bleeding, pain),  syncope, focal weakness, memory loss, numbness & tingling, skin/hair/nail changes, abnormal bruising or bleeding, anxiety, or depression.   This visit occurred during the SARS-CoV-2 public health emergency.  Safety protocols were in place, including screening questions prior to the visit, additional usage of staff PPE, and extensive cleaning of exam room while observing appropriate contact time as indicated for disinfecting solutions.      Objective:   Physical Exam General Appearance:    Alert, cooperative, no distress, appears stated age  Head:    Normocephalic, without obvious abnormality, atraumatic  Eyes:    PERRL, conjunctiva/corneas clear, EOM's intact, fundi    benign, both eyes  Ears:    Normal TM's and external ear canals, both ears   Nose:   Deferred due to COVID  Throat:   Neck:   Supple, symmetrical, trachea midline, no adenopathy;    Thyroid: no enlargement/tenderness/nodules  Back:     Symmetric, no curvature, ROM normal, no CVA tenderness  Lungs:     Clear to auscultation bilaterally, respirations unlabored  Chest Wall:    No tenderness or deformity   Heart:    Regular rate and rhythm, S1 and S2 normal, no murmur, rub   or gallop  Breast Exam:    Deferred to GYN  Abdomen:     Soft, non-tender, bowel sounds active all four quadrants,    no masses, no organomegaly  Genitalia:    Deferred to GYN  Rectal:    Extremities:   Extremities normal, atraumatic, no cyanosis or edema  Pulses:   2+ and symmetric all extremities  Skin:   Skin color, texture, turgor normal, no rashes or lesions  Lymph nodes:   Cervical, supraclavicular, and axillary nodes normal  Neurologic:   CNII-XII intact, normal strength, sensation and reflexes    throughout          Assessment & Plan:

## 2021-05-29 NOTE — Patient Instructions (Addendum)
Follow up in 1 year or as needed We'll notify you of your lab results and make any changes if needed Continue to work on healthy diet and regular exercise- you're doing great! Call with any questions or concerns Stay Safe!  Stay Healthy! Enjoy your trip!!!

## 2021-06-12 ENCOUNTER — Encounter: Payer: Self-pay | Admitting: Family Medicine

## 2021-11-04 ENCOUNTER — Other Ambulatory Visit: Payer: BC Managed Care – PPO

## 2021-11-04 ENCOUNTER — Ambulatory Visit
Admission: RE | Admit: 2021-11-04 | Discharge: 2021-11-04 | Disposition: A | Discharge status: Home / Self Care | Payer: BC Managed Care – PPO | Source: Ambulatory Visit | Attending: Obstetrics and Gynecology | Admitting: Obstetrics and Gynecology

## 2021-11-04 DIAGNOSIS — N6314 Unspecified lump in the right breast, lower inner quadrant: Secondary | ICD-10-CM | POA: Diagnosis not present

## 2021-11-04 DIAGNOSIS — R928 Other abnormal and inconclusive findings on diagnostic imaging of breast: Secondary | ICD-10-CM

## 2021-12-18 ENCOUNTER — Encounter: Payer: Self-pay | Admitting: Family Medicine

## 2021-12-18 ENCOUNTER — Ambulatory Visit (INDEPENDENT_AMBULATORY_CARE_PROVIDER_SITE_OTHER): Payer: BC Managed Care – PPO | Admitting: Family Medicine

## 2021-12-18 VITALS — BP 118/78 | HR 72 | Temp 98.2°F | Resp 17 | Ht 67.9 in | Wt 170.6 lb

## 2021-12-18 DIAGNOSIS — L03818 Cellulitis of other sites: Secondary | ICD-10-CM | POA: Diagnosis not present

## 2021-12-18 DIAGNOSIS — S70262A Insect bite (nonvenomous), left hip, initial encounter: Secondary | ICD-10-CM | POA: Diagnosis not present

## 2021-12-18 DIAGNOSIS — W57XXXA Bitten or stung by nonvenomous insect and other nonvenomous arthropods, initial encounter: Secondary | ICD-10-CM

## 2021-12-18 MED ORDER — DOXYCYCLINE HYCLATE 100 MG PO TABS: 100.0000 mg | ORAL_TABLET | Freq: Two times a day (BID) | ORAL | 0 refills | Status: DC

## 2021-12-18 NOTE — Patient Instructions (Signed)
Follow up as needed or as scheduled ?START the Doxycycline twice daily- take w/ food ?ICE the area to help w/ itching and swelling ?Call with any questions or concerns ?Hang in there!!! ?

## 2021-12-18 NOTE — Progress Notes (Signed)
? ?  Subjective:  ? ? Patient ID: Hannah Kim, female    DOB: 03/12/1961, 61 y.o.   MRN: [id number] ? ?HPI ?Tick bite- L hip.  Found tick attached on Sunday and pulled it off.  Took picture of tick- Lonestar.  Tick was embedded but not engorged.  After 24 hrs developed redness, swelling, itchy, pain.  Treated w/ alcohol wipe, abx ointment, steroid cream. ? ? ?Review of Systems ?For ROS see HPI  ?   ?Objective:  ? Physical Exam ?Vitals reviewed.  ?Constitutional:   ?   General: She is not in acute distress. ?   Appearance: Normal appearance. She is not ill-appearing.  ?HENT:  ?   Head: Normocephalic and atraumatic.  ?Skin: ?   General: Skin is warm and dry.  ?   Findings: Erythema (2" circular area of redness surrounding central bite.  warm to touch, mildly indurated, no TTP) present.  ?Neurological:  ?   Mental Status: She is alert.  ? ? ? ? ? ?   ?Assessment & Plan:  ? ?Tick bite/Cellulitis- new.  Bite site is more red and swollen than we would expect of a localized rxn.  Will start abx for cellulitis.  Typically would use Keflex but since it's a Lonestar Tick will start Doxycycline BID.  Reviewed supportive care and red flags that should prompt return.  Pt expressed understanding and is in agreement w/ plan.  ?

## 2022-01-01 ENCOUNTER — Encounter: Payer: Self-pay | Admitting: Family Medicine

## 2022-04-02 DIAGNOSIS — Z1231 Encounter for screening mammogram for malignant neoplasm of breast: Secondary | ICD-10-CM | POA: Diagnosis not present

## 2022-04-02 DIAGNOSIS — Z01419 Encounter for gynecological examination (general) (routine) without abnormal findings: Secondary | ICD-10-CM | POA: Diagnosis not present

## 2022-04-02 DIAGNOSIS — Z1389 Encounter for screening for other disorder: Secondary | ICD-10-CM | POA: Diagnosis not present

## 2022-04-02 DIAGNOSIS — Z1211 Encounter for screening for malignant neoplasm of colon: Secondary | ICD-10-CM | POA: Diagnosis not present

## 2022-04-02 LAB — HM MAMMOGRAPHY

## 2022-04-02 LAB — HM PAP SMEAR

## 2022-04-03 ENCOUNTER — Encounter: Payer: Self-pay | Admitting: Family Medicine

## 2022-04-07 ENCOUNTER — Ambulatory Visit (INDEPENDENT_AMBULATORY_CARE_PROVIDER_SITE_OTHER): Payer: BC Managed Care – PPO | Admitting: Family Medicine

## 2022-04-07 ENCOUNTER — Encounter: Payer: Self-pay | Admitting: Family Medicine

## 2022-04-07 VITALS — BP 118/70 | HR 67 | Temp 98.2°F | Resp 16 | Ht 67.9 in | Wt 168.5 lb

## 2022-04-07 DIAGNOSIS — R202 Paresthesia of skin: Secondary | ICD-10-CM | POA: Diagnosis not present

## 2022-04-07 DIAGNOSIS — W57XXXD Bitten or stung by nonvenomous insect and other nonvenomous arthropods, subsequent encounter: Secondary | ICD-10-CM

## 2022-04-07 DIAGNOSIS — M25649 Stiffness of unspecified hand, not elsewhere classified: Secondary | ICD-10-CM

## 2022-04-07 DIAGNOSIS — S70262D Insect bite (nonvenomous), left hip, subsequent encounter: Secondary | ICD-10-CM

## 2022-04-07 LAB — CBC WITH DIFFERENTIAL/PLATELET
Basophils Absolute: 0 10*3/uL (ref 0.0–0.1)
Basophils Relative: 1 % (ref 0.0–3.0)
Eosinophils Absolute: 0.1 10*3/uL (ref 0.0–0.7)
Eosinophils Relative: 2.5 % (ref 0.0–5.0)
HCT: 38.5 % (ref 36.0–46.0)
Hemoglobin: 12.9 g/dL (ref 12.0–15.0)
Lymphocytes Relative: 30.7 % (ref 12.0–46.0)
Lymphs Abs: 1.4 10*3/uL (ref 0.7–4.0)
MCHC: 33.6 g/dL (ref 30.0–36.0)
MCV: 91.2 fl (ref 78.0–100.0)
Monocytes Absolute: 0.4 10*3/uL (ref 0.1–1.0)
Monocytes Relative: 8 % (ref 3.0–12.0)
Neutro Abs: 2.7 10*3/uL (ref 1.4–7.7)
Neutrophils Relative %: 57.8 % (ref 43.0–77.0)
Platelets: 208 10*3/uL (ref 150.0–400.0)
RBC: 4.22 Mil/uL (ref 3.87–5.11)
RDW: 13.3 % (ref 11.5–15.5)
WBC: 4.6 10*3/uL (ref 4.0–10.5)

## 2022-04-07 LAB — HEPATIC FUNCTION PANEL
ALT: 13 U/L (ref 0–35)
AST: 19 U/L (ref 0–37)
Albumin: 4.4 g/dL (ref 3.5–5.2)
Alkaline Phosphatase: 59 U/L (ref 39–117)
Bilirubin, Direct: 0.1 mg/dL (ref 0.0–0.3)
Total Bilirubin: 0.5 mg/dL (ref 0.2–1.2)
Total Protein: 7.5 g/dL (ref 6.0–8.3)

## 2022-04-07 LAB — B12 AND FOLATE PANEL
Folate: >23.9 ng/mL (ref >5.9)
Vitamin B-12: 509 pg/mL (ref 211–911)

## 2022-04-07 LAB — BASIC METABOLIC PANEL
BUN: 21 mg/dL (ref 6–23)
CO2: 27 mEq/L (ref 19–32)
Calcium: 9.5 mg/dL (ref 8.4–10.5)
Chloride: 105 mEq/L (ref 96–112)
Creatinine, Ser: 0.76 mg/dL (ref 0.40–1.20)
GFR: 85.57 mL/min (ref >60.00)
Glucose, Bld: 89 mg/dL (ref 70–99)
Potassium: 5.2 mEq/L — ABNORMAL HIGH (ref 3.5–5.1)
Sodium: 139 mEq/L (ref 135–145)

## 2022-04-07 LAB — TSH: TSH: 1.69 u[IU]/mL (ref 0.35–5.50)

## 2022-04-07 MED ORDER — DOXYCYCLINE HYCLATE 100 MG PO TABS: 100.0000 mg | ORAL_TABLET | Freq: Two times a day (BID) | ORAL | 0 refills | Status: DC

## 2022-04-07 NOTE — Progress Notes (Signed)
   Subjective:    Patient ID: Hannah Kim, female    DOB: 01-Jun-1961, 61 y.o.   MRN: [id number]  HPI Tick bite- pt was bit in May and tx'd w/ Doxy.  Bite occurred on L upper hip.  Has had recurrent hives at the bite site that will come and go.  Has stiffness in joints in the mornings- particularly her hands.  Having 'zings' throughout her body.  Pains and sensations are 'never in the same place' and don't seem to have a particular trigger.  Looked at [facility] website and is worried about a tick borne illness.   Review of Systems For ROS see HPI     Objective:   Physical Exam Vitals reviewed.  Constitutional:      General: She is not in acute distress.    Appearance: Normal appearance. She is not ill-appearing.  HENT:     Head: Normocephalic and atraumatic.  Musculoskeletal:        General: No swelling, tenderness or deformity.  Skin:    General: Skin is warm and dry.     Findings: No lesion or rash.  Neurological:     General: No focal deficit present.     Mental Status: She is alert and oriented to person, place, and time.     Cranial Nerves: No cranial nerve deficit.     Motor: No weakness.     Deep Tendon Reflexes: Reflexes normal.  Psychiatric:        Mood and Affect: Mood normal.        Behavior: Behavior normal.        Thought Content: Thought content normal.           Assessment & Plan:   Tick bite L hip- pt reports she had similar sxs in 2015 when she was dx'd w/ Lyme Disease.  She did have an initial course of doxy but will repeat for 7 days to see if sxs improve.  Will also get Lyme panel.  Pt expressed understanding and is in agreement w/ plan.   Paresthesias- new.  Unclear if this is related to tick bite or other underlying issue.  Will check labs to look for metabolic cause.  Pt expressed understanding and is in agreement w/ plan.   Morning joint stiffness in hands- new.  Pt states this occurred after the tick bite and is predominately in her  hands and not other joints.  Will check ANA and RF.  Pt expressed understanding and is in agreement w/ plan.

## 2022-04-07 NOTE — Patient Instructions (Signed)
Follow up as needed or as scheduled We'll notify you of your lab results and make any changes if needed START the Doxycycline twice daily- take w/ food Call with any questions or concerns Hang in there!!!

## 2022-04-08 ENCOUNTER — Telehealth: Payer: Self-pay

## 2022-04-08 LAB — B. BURGDORFI ANTIBODIES: B burgdorferi Ab IgG+IgM: <0.9 index

## 2022-04-08 LAB — RHEUMATOID FACTOR: Rheumatoid fact SerPl-aCnc: <14 IU/mL (ref <14)

## 2022-04-08 LAB — ANA: Anti Nuclear Antibody (ANA): NEGATIVE

## 2022-04-08 NOTE — Telephone Encounter (Signed)
Informed pt of lab results  

## 2022-04-08 NOTE — Telephone Encounter (Signed)
-----   Message from Sheliah Hatch, MD sent at 04/08/2022  7:37 AM EDT ----- Labs look good!  Still waiting on the Lyme panel and autoimmune labs

## 2022-04-09 ENCOUNTER — Encounter: Payer: Self-pay | Admitting: Family Medicine

## 2022-04-22 ENCOUNTER — Encounter: Payer: Self-pay | Admitting: Family Medicine

## 2022-05-30 ENCOUNTER — Encounter: Payer: Self-pay | Admitting: Family Medicine

## 2022-05-30 ENCOUNTER — Ambulatory Visit (INDEPENDENT_AMBULATORY_CARE_PROVIDER_SITE_OTHER): Payer: BC Managed Care – PPO | Admitting: Family Medicine

## 2022-05-30 VITALS — BP 126/70 | HR 71 | Temp 97.6°F | Ht 67.5 in | Wt 161.4 lb

## 2022-05-30 DIAGNOSIS — Z0000 Encounter for general adult medical examination without abnormal findings: Secondary | ICD-10-CM

## 2022-05-30 DIAGNOSIS — Z23 Encounter for immunization: Secondary | ICD-10-CM

## 2022-05-30 DIAGNOSIS — E785 Hyperlipidemia, unspecified: Secondary | ICD-10-CM

## 2022-05-30 LAB — LIPID PANEL
Cholesterol: 192 mg/dL (ref 0–200)
HDL: 70.6 mg/dL (ref >39.00)
LDL Cholesterol: 113 mg/dL — ABNORMAL HIGH (ref 0–99)
NonHDL: 121.65
Total CHOL/HDL Ratio: 3
Triglycerides: 44 mg/dL (ref 0.0–149.0)
VLDL: 8.8 mg/dL (ref 0.0–40.0)

## 2022-05-30 LAB — BASIC METABOLIC PANEL
BUN: 17 mg/dL (ref 6–23)
CO2: 24 mEq/L (ref 19–32)
Calcium: 9.5 mg/dL (ref 8.4–10.5)
Chloride: 105 mEq/L (ref 96–112)
Creatinine, Ser: 0.81 mg/dL (ref 0.40–1.20)
GFR: 79.19 mL/min (ref >60.00)
Glucose, Bld: 93 mg/dL (ref 70–99)
Potassium: 4.7 mEq/L (ref 3.5–5.1)
Sodium: 140 mEq/L (ref 135–145)

## 2022-05-30 LAB — HEPATIC FUNCTION PANEL
ALT: 13 U/L (ref 0–35)
AST: 18 U/L (ref 0–37)
Albumin: 4.5 g/dL (ref 3.5–5.2)
Alkaline Phosphatase: 63 U/L (ref 39–117)
Bilirubin, Direct: 0.1 mg/dL (ref 0.0–0.3)
Total Bilirubin: 0.5 mg/dL (ref 0.2–1.2)
Total Protein: 7.8 g/dL (ref 6.0–8.3)

## 2022-05-30 LAB — CBC WITH DIFFERENTIAL/PLATELET
Basophils Absolute: 0.1 10*3/uL (ref 0.0–0.1)
Basophils Relative: 1.2 % (ref 0.0–3.0)
Eosinophils Absolute: 0.1 10*3/uL (ref 0.0–0.7)
Eosinophils Relative: 2 % (ref 0.0–5.0)
HCT: 39.5 % (ref 36.0–46.0)
Hemoglobin: 13.3 g/dL (ref 12.0–15.0)
Lymphocytes Relative: 25.3 % (ref 12.0–46.0)
Lymphs Abs: 1.4 10*3/uL (ref 0.7–4.0)
MCHC: 33.7 g/dL (ref 30.0–36.0)
MCV: 91.4 fl (ref 78.0–100.0)
Monocytes Absolute: 0.4 10*3/uL (ref 0.1–1.0)
Monocytes Relative: 7.7 % (ref 3.0–12.0)
Neutro Abs: 3.4 10*3/uL (ref 1.4–7.7)
Neutrophils Relative %: 63.8 % (ref 43.0–77.0)
Platelets: 234 10*3/uL (ref 150.0–400.0)
RBC: 4.32 Mil/uL (ref 3.87–5.11)
RDW: 13.5 % (ref 11.5–15.5)
WBC: 5.4 10*3/uL (ref 4.0–10.5)

## 2022-05-30 LAB — TSH: TSH: 1.84 u[IU]/mL (ref 0.35–5.50)

## 2022-05-30 NOTE — Progress Notes (Signed)
   Subjective:    Patient ID: Hannah Kim, female    DOB: April 11, 1961, 61 y.o.   MRN: [id number]  HPI CPE- UTD on mammo, colonoscopy, pap, Tdap.  Will get flu shot today.  Health Maintenance  Topic Date Due   Zoster Vaccines- Shingrix (2 of 2) 07/08/2022 (Originally 04/01/2017)   INFLUENZA VACCINE  11/09/2022 (Originally 03/11/2022)   HIV Screening  05/31/2023 (Originally 07/07/1977)   MAMMOGRAM  04/02/2024   COLONOSCOPY (Pts 45-20yrs Insurance coverage will need to be confirmed)  09/02/2024   PAP SMEAR-Modifier  04/02/2025   TETANUS/TDAP  07/13/2028   COVID-19 Vaccine  Completed   HPV VACCINES  Aged Out   Hepatitis C Screening  Discontinued      Review of Systems Patient reports no vision/ hearing changes, adenopathy,fever, persistent/recurrent hoarseness , swallowing issues, chest pain, palpitations, edema, persistant/recurrent cough, hemoptysis, dyspnea (rest/exertional/paroxysmal nocturnal), gastrointestinal bleeding (melena, rectal bleeding), abdominal pain, significant heartburn, bowel changes, GU symptoms (dysuria, hematuria, incontinence), Gyn symptoms (abnormal  bleeding, pain),  syncope, focal weakness, memory loss, numbness & tingling, skin/hair/nail changes, abnormal bruising or bleeding, anxiety, or depression.   + 8 lb weight loss- pt has been walking more and eating less    Objective:   Physical Exam General Appearance:    Alert, cooperative, no distress, appears stated age  Head:    Normocephalic, without obvious abnormality, atraumatic  Eyes:    PERRL, conjunctiva/corneas clear, EOM's intact both eyes  Ears:    Normal TM's and external ear canals, both ears  Nose:   Nares normal, septum midline, mucosa normal, no drainage    or sinus tenderness  Throat:   Lips, mucosa, and tongue normal; teeth and gums normal  Neck:   Supple, symmetrical, trachea midline, no adenopathy;    Thyroid: no enlargement/tenderness/nodules  Back:     Symmetric, no curvature, ROM  normal, no CVA tenderness  Lungs:     Clear to auscultation bilaterally, respirations unlabored  Chest Wall:    No tenderness or deformity   Heart:    Regular rate and rhythm, S1 and S2 normal, no murmur, rub   or gallop  Breast Exam:    Deferred to GYN  Abdomen:     Soft, non-tender, bowel sounds active all four quadrants,    no masses, no organomegaly  Genitalia:    Deferred to GYN  Rectal:    Extremities:   Extremities normal, atraumatic, no cyanosis or edema  Pulses:   2+ and symmetric all extremities  Skin:   Skin color, texture, turgor normal, no rashes or lesions  Lymph nodes:   Cervical, supraclavicular, and axillary nodes normal  Neurologic:   CNII-XII intact, normal strength, sensation and reflexes    throughout          Assessment & Plan:

## 2022-05-30 NOTE — Patient Instructions (Signed)
Follow up in 1 year or as needed We'll notify you of your lab results and make any changes if needed Keep up the good work on healthy diet and regular exercise- you're doing great!! Call with any questions or concerns Stay Safe!  Stay Healthy! Happy Fall!!! 

## 2022-06-02 NOTE — Progress Notes (Signed)
Informed pt of lab results  

## 2022-07-25 ENCOUNTER — Ambulatory Visit (HOSPITAL_BASED_OUTPATIENT_CLINIC_OR_DEPARTMENT_OTHER)
Admission: RE | Admit: 2022-07-25 | Discharge: 2022-07-25 | Disposition: A | Discharge status: Home / Self Care | Payer: BC Managed Care – PPO | Source: Ambulatory Visit | Attending: Family Medicine | Admitting: Family Medicine

## 2022-07-25 ENCOUNTER — Encounter: Payer: Self-pay | Admitting: Family Medicine

## 2022-07-25 ENCOUNTER — Ambulatory Visit (INDEPENDENT_AMBULATORY_CARE_PROVIDER_SITE_OTHER): Payer: BC Managed Care – PPO | Admitting: Family Medicine

## 2022-07-25 VITALS — BP 122/78 | HR 72 | Temp 98.4°F | Resp 17 | Ht 67.5 in | Wt 163.2 lb

## 2022-07-25 DIAGNOSIS — R42 Dizziness and giddiness: Secondary | ICD-10-CM

## 2022-07-25 DIAGNOSIS — M47812 Spondylosis without myelopathy or radiculopathy, cervical region: Secondary | ICD-10-CM | POA: Diagnosis not present

## 2022-07-25 MED ORDER — METHOCARBAMOL 500 MG PO TABS: 500.0000 mg | ORAL_TABLET | Freq: Every evening | ORAL | 1 refills | Status: DC | PRN

## 2022-07-25 NOTE — Progress Notes (Unsigned)
   Subjective:    Patient ID: Hannah Kim, female    DOB: [birth year], 61 y.o.   MRN: [id number]  HPI Neck pain- pt reports she has had a few occasions of leaning forward and looking at her phone where she had brief episode of dizziness and 'seeing stars'.  Sxs immediately resolved when she raised her head.  Had an episode that occurred while she was walking and looking at the ground.  When she looked up, sxs resolved.  Most recently occurred while lying w/ chin tucked to chest and again, resolved immediately.  Some neck tightness but nothing 'restrictive'.     Review of Systems For ROS see HPI     Objective:   Physical Exam Vitals reviewed.  Constitutional:      General: She is not in acute distress.    Appearance: Normal appearance. She is not ill-appearing.  HENT:     Head: Normocephalic and atraumatic.     Right Ear: Tympanic membrane and ear canal normal.     Left Ear: Tympanic membrane and ear canal normal.  Eyes:     Extraocular Movements: Extraocular movements intact.     Conjunctiva/sclera: Conjunctivae normal.     Pupils: Pupils are equal, round, and reactive to light.  Neck:     Comments: Bilateral upper trap spasm Musculoskeletal:     Cervical back: Normal range of motion. No rigidity or tenderness.  Lymphadenopathy:     Cervical: No cervical adenopathy.  Skin:    General: Skin is warm and dry.  Neurological:     General: No focal deficit present.     Mental Status: She is alert and oriented to person, place, and time.     Cranial Nerves: No cranial nerve deficit.     Motor: No weakness.     Coordination: Coordination normal.     Gait: Gait normal.  Psychiatric:        Mood and Affect: Mood normal.        Behavior: Behavior normal.        Thought Content: Thought content normal.           Assessment & Plan:   Dizziness w/ tucking chin- new.  Pt reports sxs seem to only occur w/ looking downward for a prolonged period of time w/ chin tucked.   Sxs resolve immediately w/ changing position.  Will get Cspine xrays to assess for degenerative changes.  Will refer to PT due to bilateral trap spasm and will start low dose muscle relaxer for night to improve spasm.  Pt expressed understanding and is in agreement w/ plan.  If sxs persist or worsen, will get vascular workup to assess for possible vertebral insufficiency.  Pt expressed understanding and is in agreement w/ plan.

## 2022-07-25 NOTE — Telephone Encounter (Signed)
Pt is asking if a heating pad or neck massage is advisable for current neck pains

## 2022-07-25 NOTE — Patient Instructions (Addendum)
Please go down [address] towards [facility] and get your xray done at the [facility] on [address]- no appt needed. We'll call you to schedule your physical therapy appt Use the Methocarbamol as needed at night for muscle spasm Try and avoid prolonged neck flexion (tucking of your chin) Call with any questions or concerns Hang in there! Happy Holidays!!

## 2022-07-28 ENCOUNTER — Telehealth: Payer: Self-pay

## 2022-07-28 NOTE — Telephone Encounter (Signed)
-----   Message from Sheliah Hatch, MD sent at 07/27/2022  7:53 PM EST ----- There is minimal spondylosis (degenerative changes) seen in the neck.  Nothing acute or obvious that is causing your numbness

## 2022-07-28 NOTE — Telephone Encounter (Signed)
Informed pt of scan results

## 2022-08-12 ENCOUNTER — Encounter: Payer: Self-pay | Admitting: Family

## 2022-08-12 ENCOUNTER — Ambulatory Visit (INDEPENDENT_AMBULATORY_CARE_PROVIDER_SITE_OTHER): Payer: BC Managed Care – PPO | Admitting: Family

## 2022-08-12 VITALS — BP 118/72 | HR 89 | Temp 98.7°F | Ht 67.5 in | Wt 162.0 lb

## 2022-08-12 DIAGNOSIS — R002 Palpitations: Secondary | ICD-10-CM

## 2022-08-12 DIAGNOSIS — M5412 Radiculopathy, cervical region: Secondary | ICD-10-CM

## 2022-08-12 DIAGNOSIS — F430 Acute stress reaction: Secondary | ICD-10-CM

## 2022-08-12 DIAGNOSIS — R42 Dizziness and giddiness: Secondary | ICD-10-CM

## 2022-08-12 NOTE — Progress Notes (Signed)
Acute Office Visit  Subjective:     Patient ID: Hannah Kim, female    DOB: [birth year], 62 y.o.   MRN: [id number]  Chief Complaint  Patient presents with  . Neck Pain    Pt states she has pain in the right arm, not pain but like its trying to go to sleep, pt states neck stretches help a little, pt also feels like she is having palpitations   . Sore Throat    Pt states she has a sore throat and congestion , for the last few days     HPI Patient is in today with improved but continued "different" type of feeling in her right arm particularly when she tucks her chin into her chest. When she lift her head, the symptoms resolve. She is scheduled to start PT January 18th. She was prescribed and took muscle relaxers that helped some but she stopped taking them.   Also has concerns of chest flutters that typically occur in the mornings for about 15 beats over the last 6 days. She describes them as irregular and skipping a beat. Has had an increase in caffeine intake. No chest pain, dizziness, or numbness. She has no issues with exercise. Has a history of anxiety and panic attacks in the past. Reports feeling more intense but is unsure why. She is concerned and wants to be sure her heart is ok.   Review of Systems  Constitutional: Negative.   Respiratory: Negative.    Cardiovascular:  Positive for palpitations.  Musculoskeletal: Negative.   Neurological:  Negative for dizziness.  Psychiatric/Behavioral:  The patient is nervous/anxious.   All other systems reviewed and are negative. Past Medical History:  Diagnosis Date  . Allergy   . GERD (gastroesophageal reflux disease)   . History of chicken pox     Social History   Socioeconomic History  . Marital status: Single    Spouse name: Not on file  . Number of children: Not on file  . Years of education: Not on file  . Highest education level: Not on file  Occupational History  . Occupation: Pharmacist, hospital  Tobacco Use  .  Smoking status: Never  . Smokeless tobacco: Never  Vaping Use  . Vaping Use: Never used  Substance and Sexual Activity  . Alcohol use: Yes    Comment: 2/month  . Drug use: No  . Sexual activity: Yes  Other Topics Concern  . Not on file  Social History Narrative  . Not on file   Social Determinants of Health   Financial Resource Strain: Not on file  Food Insecurity: Not on file  Transportation Needs: Not on file  Physical Activity: Not on file  Stress: Not on file  Social Connections: Not on file  Intimate Partner Violence: Not on file    No past surgical history on file.  Family History  Problem Relation Age of Onset  . Arthritis Mother   . Hearing loss Mother   . Scleroderma Mother   . COPD Father   . Hypertension Father   . Hypertension Sister   . Hypertension Sister     Allergies  Allergen Reactions  . Sulfa Antibiotics Rash    Current Outpatient Medications on File Prior to Visit  Medication Sig Dispense Refill  . Calcium-Magnesium-Vitamin D (CALCIUM 1200+D3 PO) Take by mouth.    . Multiple Vitamin (MULTIVITAMIN) capsule Take 1 capsule by mouth daily.    . Probiotic Product (PROBIOTIC-10 PO) Take by mouth.    Marland Kitchen  Ashwagandha 300 MG TABS Take 300 mg by mouth 1 day or 1 dose. (Patient not taking: Reported on 08/12/2022)    . methocarbamol (ROBAXIN) 500 MG tablet Take 1 tablet (500 mg total) by mouth at bedtime as needed for muscle spasms. (Patient not taking: Reported on 08/12/2022) 30 tablet 1   No current facility-administered medications on file prior to visit.    BP 118/72   Pulse 89   Temp 98.7 F (37.1 C)   Ht 5' 7.5" (1.715 m)   Wt 162 lb (73.5 kg)   LMP 04/23/2013   SpO2 99%   BMI 25.00 kg/m chart      Objective:    BP 118/72   Pulse 89   Temp 98.7 F (37.1 C)   Ht 5' 7.5" (1.715 m)   Wt 162 lb (73.5 kg)   LMP 04/23/2013   SpO2 99%   BMI 25.00 kg/m    Physical Exam Vitals and nursing note reviewed.  Constitutional:       Appearance: She is well-developed and normal weight.  Cardiovascular:     Rate and Rhythm: Normal rate and regular rhythm.  Pulmonary:     Effort: Pulmonary effort is normal.     Breath sounds: Normal breath sounds.  Abdominal:     General: Bowel sounds are normal.     Palpations: Abdomen is soft.  Musculoskeletal:     Cervical back: Normal range of motion and neck supple.  Skin:    General: Skin is warm and dry.  Neurological:     General: No focal deficit present.     Mental Status: She is alert and oriented to person, place, and time.  Psychiatric:        Mood and Affect: Mood normal.        Behavior: Behavior normal.   No results found for any visits on 08/12/22.      Assessment & Plan:   Problem List Items Addressed This Visit   None Visit Diagnoses     Heart palpitations    -  Primary   Relevant Orders   Holter monitor - 48 hour   Acute reaction to stress       Cervical radiculopathy       Dizziness after extension of neck       Relevant Orders   Ambulatory referral to Vascular Surgery       No orders of the defined types were placed in this encounter.   No follow-ups on file.  Kennyth Arnold, FNP

## 2022-08-13 ENCOUNTER — Ambulatory Visit: Payer: BC Managed Care – PPO | Attending: Family

## 2022-08-13 DIAGNOSIS — R002 Palpitations: Secondary | ICD-10-CM

## 2022-08-13 NOTE — Progress Notes (Unsigned)
Enrolled for [employer] to mail a ZIO XT long term holter monitor to the patients address on file.   DOD to read. 

## 2022-08-17 DIAGNOSIS — R002 Palpitations: Secondary | ICD-10-CM

## 2022-08-27 ENCOUNTER — Ambulatory Visit (INDEPENDENT_AMBULATORY_CARE_PROVIDER_SITE_OTHER): Payer: BC Managed Care – PPO

## 2022-08-27 ENCOUNTER — Encounter: Payer: Self-pay | Admitting: Vascular Surgery

## 2022-08-27 ENCOUNTER — Other Ambulatory Visit: Payer: Self-pay

## 2022-08-27 ENCOUNTER — Ambulatory Visit (INDEPENDENT_AMBULATORY_CARE_PROVIDER_SITE_OTHER): Payer: BC Managed Care – PPO | Admitting: Vascular Surgery

## 2022-08-27 VITALS — BP 125/79 | HR 81 | Temp 98.5°F | Ht 67.5 in | Wt 162.6 lb

## 2022-08-27 DIAGNOSIS — H8110 Benign paroxysmal vertigo, unspecified ear: Secondary | ICD-10-CM

## 2022-08-27 DIAGNOSIS — I6529 Occlusion and stenosis of unspecified carotid artery: Secondary | ICD-10-CM | POA: Diagnosis not present

## 2022-08-27 NOTE — Progress Notes (Signed)
[facility]  Patient name: Hannah Kim MRN: [id number] DOB: 1960-08-30 Sex: female  REASON FOR CONSULT: Evaluation of episodes of vertigo, rule out extracranial cerebrovascular occlusive disease  HPI: Hannah Kim is a 62 y.o. female, who is here today for evaluation.  She reports an episode of looking down at her cell phone.  After period of time she looked up and had sensation of vertigo.  This lasted for several seconds and then resolved.  She has not had similar situation.  She does report sensation of heart palpitations and underwent recent Holter monitor and is awaiting results of this.  Denies any focal neurologic deficits.  Past Medical History:  Diagnosis Date   Allergy    GERD (gastroesophageal reflux disease)    History of chicken pox     Family History  Problem Relation Age of Onset   Arthritis Mother    Hearing loss Mother    Scleroderma Mother    COPD Father    Hypertension Father    Hypertension Sister    Hypertension Sister     SOCIAL HISTORY: Social History   Socioeconomic History   Marital status: Single    Spouse name: Not on file   Number of children: Not on file   Years of education: Not on file   Highest education level: Not on file  Occupational History   Occupation: Teacher  Tobacco Use   Smoking status: Never   Smokeless tobacco: Never  Vaping Use   Vaping Use: Never used  Substance and Sexual Activity   Alcohol use: Yes    Comment: 2/month   Drug use: No   Sexual activity: Yes  Other Topics Concern   Not on file  Social History Narrative   Not on file   Social Determinants of Health   Financial Resource Strain: Not on file  Food Insecurity: Not on file  Transportation Needs: Not on file  Physical Activity: Not on file  Stress: Not on file  Social Connections: Not on file  Intimate Partner Violence: Not on file    Allergies  Allergen Reactions    Sulfa Antibiotics Rash    Current Outpatient Medications  Medication Sig Dispense Refill   Calcium-Magnesium-Vitamin D (CALCIUM 1200+D3 PO) Take by mouth.     methocarbamol (ROBAXIN) 500 MG tablet Take 1 tablet (500 mg total) by mouth at bedtime as needed for muscle spasms. 30 tablet 1   Multiple Vitamin (MULTIVITAMIN) capsule Take 1 capsule by mouth daily.     Probiotic Product (PROBIOTIC-10 PO) Take by mouth.     Ashwagandha 300 MG TABS Take 300 mg by mouth 1 day or 1 dose. (Patient not taking: Reported on 08/12/2022)     No current facility-administered medications for this visit.    REVIEW OF SYSTEMS:  [X]  denotes positive finding, [ ]  denotes negative finding Cardiac  Comments:  Chest pain or chest pressure:    Shortness of breath upon exertion:    Short of breath when lying flat:    Irregular heart rhythm: x       Vascular    Pain in calf, thigh, or hip brought on by ambulation:    Pain in feet at night that wakes you up from your sleep:     Blood clot in your veins:    Leg swelling:         Pulmonary    Oxygen at home:    Productive cough:  Wheezing:         Neurologic    Sudden weakness in arms or legs:     Sudden numbness in arms or legs:     Sudden onset of difficulty speaking or slurred speech:    Temporary loss of vision in one eye:     Problems with dizziness:         Gastrointestinal    Blood in stool:     Vomited blood:         Genitourinary    Burning when urinating:     Blood in urine:        Psychiatric    Major depression:         Hematologic    Bleeding problems:    Problems with blood clotting too easily:        Skin    Rashes or ulcers:        Constitutional    Fever or chills:      PHYSICAL EXAM: Vitals:   08/27/22 1431 08/27/22 1434  BP: 124/79 125/79  Pulse: 81   Temp: 98.5 F (36.9 C)   SpO2: 94%   Weight: 162 lb 9.6 oz (73.8 kg)   Height: 5' 7.5" (1.715 m)     GENERAL: The patient is a well-nourished female, in  no acute distress. The vital signs are documented above. CARDIOVASCULAR: Carotid arteries without bruits bilaterally.  2+ radial pulses bilaterally. PULMONARY: There is good air exchange  MUSCULOSKELETAL: There are no major deformities or cyanosis. NEUROLOGIC: No focal weakness or paresthesias are detected. SKIN: There are no ulcers or rashes noted. PSYCHIATRIC: The patient has a normal affect.  DATA:  Carotid duplex in our office today reveals no evidence of carotid stenosis and normal antegrade flow in her vertebral arteries bilaterally  MEDICAL ISSUES: Episode of vertigo.  Unclear as to the etiology of this.  Explained that I do not see any evidence of carotid or vertebral disease.  She will await her Holter monitor.  I explained that in all likelihood there will be no clear cause identified.  She will see [place] again on an as-needed basis   Rosetta Posner, MD [facility] Tel [phone] Pager [phone]  Note: Portions of this report may have been transcribed using voice recognition software.  Every effort has been made to ensure accuracy; however, inadvertent computerized transcription errors may still be present.

## 2022-08-27 NOTE — Therapy (Signed)
OUTPATIENT PHYSICAL THERAPY CERVICAL EVALUATION   Patient Name: Hannah Kim MRN: [id number] DOB:11-Jan-1961, 62 y.o., female Today's Date: 08/28/2022  END OF SESSION:  PT End of Session - 08/28/22 0904     Visit Number 1    Number of Visits 4    Date for PT Re-Evaluation 09/17/22    Authorization Type BCBS    Authorization - Visit Number 1    Authorization - Number of Visits 30    Progress Note Due on Visit 4    PT Start Time 0810    PT Stop Time 0850    PT Time Calculation (min) 40 min    Activity Tolerance Patient tolerated treatment well    Behavior During Therapy WFL for tasks assessed/performed             Past Medical History:  Diagnosis Date   Allergy    GERD (gastroesophageal reflux disease)    History of chicken pox    Past Surgical History:  Procedure Laterality Date   COLONOSCOPY  1992   COLONOSCOPY, ESOPHAGOGASTRODUODENOSCOPY (EGD) AND ESOPHAGEAL DILATION  2015   WISDOM TOOTH EXTRACTION Bilateral 1984   Patient Active Problem List   Diagnosis Date Noted   GERD (gastroesophageal reflux disease) 02/23/2019   Physical exam 09/02/2017   Hyperlipidemia 09/02/2017   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  Since she has been to the MD and has been keeping her head up she has not had any sx but she has been very aware of her posture.  Yesterday she slipped on the ice and fell on her bottom.    PERTINENT  HISTORY: Per Dr Birdie Riddle:  Dizziness w/ tucking chin- new.  Pt reports sxs seem to only occur w/ looking downward for a prolonged period of time w/ chin tucked.  Sxs resolve immediately w/ changing position.  Will get Cspine xrays to assess for degenerative changes.  Will refer to PT due to bilateral trap spasm and will start low dose muscle relaxer for night to improve spasm.  Pt  expressed understanding and is in agreement w/ plan.  If sxs persist or worsen, will get vascular workup to assess for possible vertebral insufficiency.  Pt expressed understanding and is in agreement w/ plan.   PAIN:  Are you having pain? No but at times goes as high as a 4/10 and sometimes seems like it goes into her jaw.   PRECAUTIONS: None  WEIGHT BEARING RESTRICTIONS: No  FALLS:  Has patient fallen in last 6 months? Yes. Number of falls 1  LIVING ENVIRONMENT: Lives with: lives with their family Lives in: House/apartment OCCUPATION: [employer]   PLOF: Independent  PATIENT GOALS: Pt wants to make sure if there is something she can do to keep her mm from tensing up.    NEXT MD VISIT: unknown   OBJECTIVE:   DIAGNOSTIC FINDINGS:  N/A   COGNITION: Overall cognitive status: Within functional limits for tasks assessed  SENSATION: WFL  POSTURE: No Significant postural limitations  PALPATION: Extremely tight traps with moderate spasms B   Smooth pursuits:  no dizziness but at the end of all just doesn't feel right Gaze stabilization:  no dizziness but feels funny \ Nose to knee (-) but feels off both to the Right and the left.   CERVICAL ROM: wfl    TODAY'S TREATMENT:                                                                                                                              DATE: 08/28/22 Evaluation Sitting: Smooth pursuits x 5 all directions Gaze stabilization x 5 all directions Nose to Rt knee then Lt x 5 all directions Scapular retraction x 5 Cervical retraction x 5  PATIENT  EDUCATION:  Education details: HEP the need to relax trap mm soft music, deep breathing with heat and gentle self massage with tennis ball Person educated: Patient Education method: Explanation, Demonstration, and Handouts Education comprehension: verbalized understanding  HOME EXERCISE PROGRAM: Access Code: [email or web address] URL: [email or web address] Date: 08/28/2022 Prepared by: Rayetta Humphrey  Exercises - Seated Gaze Stabilization with Head Rotation  - 5 x daily - 7 x weekly - 1 sets - 10 reps - 2 hold - Seated Gaze Stabilization with Head Nod  - 5 x daily - 7 x weekly - 1 sets - 10 reps - 2 hold - Seated Horizontal Smooth Pursuit  - 5 x daily - 7 x weekly - 1 sets - 10 reps - 2 hold - Seated Vertical Smooth Pursuit  - 5 x daily - 7 x weekly - 1 sets - 10 reps - 2 hold - Seated Nose to Right Knee Vestibular Habituation  - 3 x daily - 7 x weekly - 1 sets - 10 reps - Seated Nose to Left Knee Vestibular Habituation  - 3 x daily - 7 x weekly - 1 sets - 10 reps - Seated Scapular Retraction  - 3 x daily - 7 x weekly - 1 sets - 10 reps - 3-5"  hold - Seated Cervical Retraction  - 3 x daily - 7 x weekly - 1 sets - 10 reps - 3-5" hold  ASSESSMENT:  CLINICAL IMPRESSION: Patient is a 62 y.o. female who was seen today for physical therapy evaluation and treatment for dizziness non BPPV. Evaluation demonstrates significant tightness and spasm in B trapezius area as well as sx reproduced with quick eye motion.  Pt will benefit from skilled PT to assist in decreasing spasms and habituating eye motion activity.   OBJECTIVE IMPAIRMENTS: dizziness, increased muscle spasms, postural dysfunction, and pain.   ACTIVITY LIMITATIONS: carrying, lifting, and reading REHAB POTENTIAL: Good  CLINICAL DECISION MAKING: Stable/uncomplicated  EVALUATION COMPLEXITY: Low   GOALS: Goals reviewed with patient? No  SHORT TERM GOALS: Target date: 09/08/2022  PT to be I in HEP to stop dizzy sensation   Goal status: INITIAL  2.  PT to be I in self treatment techniques to decrease spasm in trap area to no longer have pain going into jaw. Goal status: INITIAL   LONG TERM GOALS: Target date: 09/19/2022  PT to be I in advanced HEP to no longer have dizziness while walking Goal status: INITIAL  2.  Pt to only have mild spasm in trap area and to verbalize knowing techniques to complete to decease spasms  Goal status: INITIAL     PLAN:  PT FREQUENCY: 2x/week  PT DURATION: 2 weeks  PLANNED INTERVENTIONS: Therapeutic exercises, Therapeutic activity, Patient/Family education, Self Care, Joint mobilization, and Manual therapy  PLAN FOR NEXT SESSION: complete eye gaze standing/ walking begin manual and trap stretch.   Virgina Organ, PT CLT [phone]  9:03

## 2022-08-28 ENCOUNTER — Other Ambulatory Visit: Payer: Self-pay

## 2022-08-28 ENCOUNTER — Ambulatory Visit (HOSPITAL_COMMUNITY): Payer: BC Managed Care – PPO | Attending: Family Medicine | Admitting: Physical Therapy

## 2022-08-28 DIAGNOSIS — H8112 Benign paroxysmal vertigo, left ear: Secondary | ICD-10-CM | POA: Diagnosis not present

## 2022-08-28 DIAGNOSIS — M542 Cervicalgia: Secondary | ICD-10-CM | POA: Diagnosis not present

## 2022-08-28 DIAGNOSIS — R42 Dizziness and giddiness: Secondary | ICD-10-CM | POA: Diagnosis not present

## 2022-08-28 DIAGNOSIS — M62838 Other muscle spasm: Secondary | ICD-10-CM | POA: Insufficient documentation

## 2022-08-29 ENCOUNTER — Other Ambulatory Visit: Payer: Self-pay | Admitting: Family

## 2022-08-29 ENCOUNTER — Encounter: Payer: Self-pay | Admitting: Family Medicine

## 2022-08-29 DIAGNOSIS — I471 Supraventricular tachycardia, unspecified: Secondary | ICD-10-CM

## 2022-08-29 NOTE — Telephone Encounter (Signed)
Pt has follow up questions about holter monitor and referral. I am happy to explain the referral process to her can you please just elaborate if there was anything wrong with results?

## 2022-09-01 ENCOUNTER — Encounter (HOSPITAL_COMMUNITY): Payer: BC Managed Care – PPO | Admitting: Physical Therapy

## 2022-09-03 ENCOUNTER — Ambulatory Visit (HOSPITAL_COMMUNITY): Payer: BC Managed Care – PPO | Admitting: Physical Therapy

## 2022-09-03 ENCOUNTER — Other Ambulatory Visit: Payer: Self-pay

## 2022-09-03 DIAGNOSIS — R42 Dizziness and giddiness: Secondary | ICD-10-CM | POA: Diagnosis not present

## 2022-09-03 DIAGNOSIS — M62838 Other muscle spasm: Secondary | ICD-10-CM

## 2022-09-03 DIAGNOSIS — M542 Cervicalgia: Secondary | ICD-10-CM | POA: Diagnosis not present

## 2022-09-03 DIAGNOSIS — H8112 Benign paroxysmal vertigo, left ear: Secondary | ICD-10-CM | POA: Diagnosis not present

## 2022-09-03 NOTE — Therapy (Addendum)
OUTPATIENT PHYSICAL THERAPY CERVICAL EVALUATION   Patient Name: Hannah Kim MRN: [id number] DOB:[birth year], 62 y.o., female Today's Date: 09/03/2022  END OF SESSION:  PT End of Session - 09/03/22 1604     Visit Number 2    Number of Visits 4    Date for PT Re-Evaluation 09/17/22    Authorization Type BCBS    Authorization - Visit Number 2    Authorization - Number of Visits 30    Progress Note Due on Visit 4    PT Start Time 1604    PT Stop Time 1645    PT Time Calculation (min) 41 min    Activity Tolerance Patient tolerated treatment well    Behavior During Therapy WFL for tasks assessed/performed             Past Medical History:  Diagnosis Date   Allergy    GERD (gastroesophageal reflux disease)    History of chicken pox    Past Surgical History:  Procedure Laterality Date   COLONOSCOPY  1992   COLONOSCOPY, ESOPHAGOGASTRODUODENOSCOPY (EGD) AND ESOPHAGEAL DILATION  2015   WISDOM TOOTH EXTRACTION Bilateral 1984   Patient Active Problem List   Diagnosis Date Noted   GERD (gastroesophageal reflux disease) 02/23/2019   Physical exam 09/02/2017   Hyperlipidemia 09/02/2017   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  Pt had some questions which were answered by the therapist.  Pt states sx overall have decreased.  PERTINENT HISTORY: Per Dr Birdie Riddle:  Dizziness w/ tucking chin- new.  Pt reports sxs seem to only occur w/ looking downward for  a prolonged period of time w/ chin tucked.  Sxs resolve immediately w/ changing position.  Will get Cspine xrays to assess for degenerative changes.  Will refer to PT due to bilateral trap spasm and will start low dose muscle relaxer for night to improve spasm.  Pt expressed understanding and is in agreement w/ plan.  If sxs persist or worsen, will get vascular workup to assess for possible vertebral insufficiency.  Pt expressed understanding  and is in agreement w/ plan.   PAIN:  Are you having pain? No but at times goes as high as a 4/10 and sometimes seems like it goes into her jaw.   PRECAUTIONS: None  WEIGHT BEARING RESTRICTIONS: No  FALLS:  Has patient fallen in last 6 months? Yes. Number of falls 1  LIVING ENVIRONMENT: Lives with: lives with their family Lives in: House/apartment OCCUPATION: [employer]   PLOF: Independent  PATIENT GOALS: Pt wants to make sure if there is something she can do to keep her mm from tensing up.    NEXT MD VISIT: unknown   OBJECTIVE:   DIAGNOSTIC FINDINGS:  N/A   COGNITION: Overall cognitive status: Within functional limits for tasks assessed  SENSATION: WFL  POSTURE: No Significant postural limitations  PALPATION: Extremely tight traps with moderate spasms B   Smooth pursuits:  no dizziness but at the end of all just doesn't feel right Gaze stabilization:  no dizziness but feels funny \ Nose to knee (-) but feels off both to the Right and the left.   CERVICAL ROM: wfl    TODAY'S TREATMENT:                                                                                                                              DATE:  09/03/22: Supine:  manual to decrease spasm and tightness and improve ROM. Cervical  retraction x 10 Scapular retraction x 10 Cervical rotation B x 10 Sitting trap stretch x 5 B   08/28/22 Evaluation Sitting: Smooth pursuits x 5 all directions Gaze stabilization x 5 all directions Nose to Rt knee then Lt x 5 all  directions Scapular retraction x 5 Cervical retraction x 5  PATIENT EDUCATION:  Education details: HEP the need to relax trap mm soft music, deep breathing with heat and gentle self massage with tennis ball Person educated: Patient Education method: Explanation, Demonstration, and Handouts Education comprehension: verbalized understanding  HOME EXERCISE PROGRAM: Access Code: [email or web address] URL: [email or web address] Date: 08/28/2022 Prepared by: Rayetta Humphrey  Exercises - Seated Gaze Stabilization with Head Rotation  - 5 x daily - 7 x weekly - 1 sets - 10 reps - 2 hold - Seated Gaze Stabilization with Head Nod  - 5 x daily - 7 x weekly - 1 sets - 10 reps - 2 hold - Seated Horizontal Smooth Pursuit  - 5 x daily - 7 x weekly - 1 sets - 10 reps - 2 hold - Seated Vertical Smooth Pursuit  - 5 x daily - 7 x weekly - 1 sets - 10 reps - 2 hold - Seated Nose to Right Knee Vestibular Habituation  - 3 x daily - 7 x weekly - 1 sets - 10 reps - Seated Nose to Left Knee Vestibular Habituation  - 3 x daily - 7 x weekly - 1 sets - 10 reps - Seated Scapular Retraction  - 3 x daily - 7 x weekly -  1 sets - 10 reps - 3-5" hold - Seated Cervical Retraction  - 3 x daily - 7 x weekly - 1 sets - 10 reps - 3-5" hold  ASSESSMENT:  CLINICAL IMPRESSION:  Therapist reviewed evaluation and goals with patient.  Pt's sx significantly decreased.  MM spasms reacted favorably to manual.  Pt will continue to benefit from skilled PT to assist in decreasing spasms and habituating eye motion activity.   OBJECTIVE IMPAIRMENTS: dizziness, increased muscle spasms, postural dysfunction, and pain.   ACTIVITY LIMITATIONS: carrying, lifting, and reading REHAB POTENTIAL: Good  CLINICAL DECISION MAKING: Stable/uncomplicated  EVALUATION COMPLEXITY: Low   GOALS: Goals reviewed with patient? No  SHORT TERM GOALS: Target date: 09/08/2022  PT to be I in HEP to stop dizzy sensation  Goal status: IN PROGRESS  2.   PT to be I in self treatment techniques to decrease spasm in trap area to no longer have pain going into jaw. Goal status: IN PROGRESS   LONG TERM GOALS: Target date: 09/19/2022  PT to be I in advanced HEP to no longer have dizziness while walking Goal status: IN PROGRESS  2.  Pt to only have mild spasm in trap area and to verbalize knowing techniques to complete to decease spasms  Goal status: IN PROGRESS     PLAN:  PT FREQUENCY: 2x/week  PT DURATION: 2 weeks  PLANNED INTERVENTIONS: Therapeutic exercises, Therapeutic activity, Patient/Family education, Self Care, Joint mobilization, and Manual therapy  PLAN FOR NEXT SESSION: complete eye gaze standing/ walking begin manual and trap stretch.   Virgina Organ, PT CLT [phone]  [phone]

## 2022-09-10 ENCOUNTER — Ambulatory Visit (HOSPITAL_COMMUNITY): Payer: BC Managed Care – PPO | Admitting: Physical Therapy

## 2022-09-10 DIAGNOSIS — M542 Cervicalgia: Secondary | ICD-10-CM

## 2022-09-10 DIAGNOSIS — R42 Dizziness and giddiness: Secondary | ICD-10-CM | POA: Diagnosis not present

## 2022-09-10 DIAGNOSIS — M62838 Other muscle spasm: Secondary | ICD-10-CM | POA: Diagnosis not present

## 2022-09-10 DIAGNOSIS — H8112 Benign paroxysmal vertigo, left ear: Secondary | ICD-10-CM

## 2022-09-10 NOTE — Therapy (Signed)
OUTPATIENT PHYSICAL THERAPY CERVICAL EVALUATION   Patient Name: Hannah Kim MRN: [id number] DOB:29-Mar-1961, 62 y.o., female Today's Date: 09/10/2022 PHYSICAL THERAPY DISCHARGE SUMMARY  Visits from Start of Care: 3  Current functional level related to goals / functional outcomes: Mild spasm , I in HEP , no vertigo    Remaining deficits: none   Education / Equipment: HEP   Patient agrees to discharge. Patient goals were met. Patient is being discharged due to meeting the stated rehab goals.  END OF SESSION:  PT End of Session - 09/10/22 1645    Visit Number 3    Number of Visits 3   Date for PT Re-Evaluation 09/17/22    Authorization Type BCBS    Authorization - Visit Number 3    Authorization - Number of Visits 30    Progress Note Due on Visit 4    PT Start Time 1600    PT Stop Time 1640    PT Time Calculation (min) 40 min    Activity Tolerance Patient tolerated treatment well    Behavior During Therapy WFL for tasks assessed/performed             Past Medical History:  Diagnosis Date   Allergy    GERD (gastroesophageal reflux disease)    History of chicken pox    Past Surgical History:  Procedure Laterality Date   COLONOSCOPY  1992   COLONOSCOPY, ESOPHAGOGASTRODUODENOSCOPY (EGD) AND ESOPHAGEAL DILATION  2015   WISDOM TOOTH EXTRACTION Bilateral 1984   Patient Active Problem List   Diagnosis Date Noted   GERD (gastroesophageal reflux disease) 02/23/2019   Physical exam 09/02/2017   Hyperlipidemia 09/02/2017   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   PERTINENT HISTORY: Per Dr Birdie Riddle:  Dizziness w/ tucking chin- new.  Pt reports sxs seem to only occur w/ looking downward for a prolonged period of time w/ chin tucked.  Sxs resolve immediately w/ changing position.  Will get Cspine xrays to assess for degenerative changes.  Will refer to PT due to bilateral trap  spasm and will start low dose muscle relaxer for night to improve spasm.  Pt expressed understanding and is in agreement w/ plan.  If sxs persist or worsen, will get vascular workup to assess for possible vertebral insufficiency.  Pt expressed understanding and is in agreement w/ plan.   PAIN:  Are you having pain? No but at times feels tight  PRECAUTIONS: None  WEIGHT BEARING RESTRICTIONS: No  FALLS:  Has patient fallen in last 6 months? Yes. Number of falls 1  LIVING ENVIRONMENT: Lives with: lives with their family Lives in: House/apartment OCCUPATION: [employer]   PLOF: Independent  PATIENT GOALS: Pt wants to make sure if there is something she can do to keep her mm from tensing up.    NEXT MD VISIT: unknown   OBJECTIVE:   DIAGNOSTIC FINDINGS:  N/A   COGNITION: Overall cognitive status: Within functional limits for tasks assessed  SENSATION: WFL  POSTURE: No Significant postural limitations  PALPATION: Extremely tight traps with moderate spasms B   Smooth pursuits:  no dizziness but at the end of all just doesn't feel right Gaze stabilization:  no dizziness but feels funny \ Nose to knee (-) but feels off both to the Right and the left.   CERVICAL ROM: wfl    TODAY'S TREATMENT:                                                                                                                              DATE:  09/10/2022 Supine:  manual to decrease spasm and tightness and improve ROM. Cervical  retraction x 10 Scapular retraction x  10 Cervical rotation B x 10 Sitting  trap stretch x 3 B 30" each Retro cervical rolls.  Cervical rotation x 5   09/03/22: Supine:  manual to decrease spasm and tightness and improve ROM. Cervical  retraction x 10 Scapular retraction x 10 Cervical rotation B x 10 Sitting trap stretch x 5 B  08/28/22 Evaluation Sitting: Smooth pursuits x 5 all directions Gaze stabilization x 5 all directions Nose to Rt knee then Lt x 5 all directions Scapular retraction x 5 Cervical retraction x 5  PATIENT EDUCATION:  Education details: HEP the need to relax trap mm soft music, deep breathing with heat and gentle self massage with tennis ball Person educated: Patient Education method: Explanation, Demonstration, and Handouts Education comprehension: verbalized understanding  HOME EXERCISE PROGRAM: Access Code: [email or web address] URL: [email or web address] Date: 08/28/2022 Prepared by: Rayetta Humphrey  Exercises - Seated Gaze Stabilization with Head Rotation  - 5 x daily - 7 x weekly - 1 sets - 10 reps - 2 hold - Seated Gaze Stabilization with Head Nod  - 5 x daily - 7 x weekly - 1 sets - 10 reps - 2 hold - Seated Horizontal Smooth Pursuit  - 5 x daily - 7 x weekly - 1 sets - 10 reps - 2 hold - Seated Vertical Smooth Pursuit  - 5 x daily -  7 x weekly - 1 sets - 10 reps - 2 hold - Seated Nose to Right Knee Vestibular Habituation  - 3 x daily - 7 x weekly - 1 sets - 10 reps - Seated Nose to Left Knee Vestibular Habituation  - 3 x daily - 7 x weekly - 1 sets - 10 reps - Seated Scapular Retraction  - 3 x daily - 7 x weekly - 1 sets - 10 reps - 3-5" hold - Seated Cervical Retraction  - 3 x daily - 7 x weekly - 1 sets - 10 reps - 3-5" hold  ASSESSMENT:  CLINICAL IMPRESSION:    Pt's sx significantly decreased.  MM spasms reacted favorably to manual.  Pt will continue to benefit from skilled PT to assist in decreasing spasms and habituating eye motion activity.   OBJECTIVE IMPAIRMENTS: dizziness,  increased muscle spasms, postural dysfunction, and pain.   ACTIVITY LIMITATIONS: carrying, lifting, and reading REHAB POTENTIAL: Good  CLINICAL DECISION MAKING: Stable/uncomplicated  EVALUATION COMPLEXITY: Low   GOALS: Goals reviewed with patient? No  SHORT TERM GOALS: Target date: 09/08/2022  PT to be I in HEP to stop dizzy sensation  Goal status: MET  2.  PT to be I in self treatment techniques to decrease spasm in trap area to no longer have pain going into jaw. Goal status: MET   LONG TERM GOALS: Target date: 09/19/2022  PT to be I in advanced HEP to no longer have dizziness while walking Goal status: MET  2.  Pt to only have mild spasm in trap area and to verbalize knowing techniques to complete to decease spasms  Goal status: MET     PLAN:  PT FREQUENCY: 2x/week  PT DURATION: 2 weeks  PLANNED INTERVENTIONS: Therapeutic exercises, Therapeutic activity, Patient/Family education, Self Care, Joint mobilization, and Manual therapy  PLAN FOR NEXT SESSION: complete eye gaze standing/ walking begin manual and trap stretch.   Rayetta Humphrey, [place] [phone]  [phone]

## 2022-09-17 ENCOUNTER — Ambulatory Visit: Payer: BC Managed Care – PPO | Attending: Cardiology | Admitting: Cardiology

## 2022-09-17 ENCOUNTER — Encounter: Payer: Self-pay | Admitting: Cardiology

## 2022-09-17 VITALS — BP 130/74 | HR 72 | Ht 67.5 in | Wt 164.0 lb

## 2022-09-17 DIAGNOSIS — R9431 Abnormal electrocardiogram [ECG] [EKG]: Secondary | ICD-10-CM | POA: Diagnosis not present

## 2022-09-17 DIAGNOSIS — R002 Palpitations: Secondary | ICD-10-CM | POA: Diagnosis not present

## 2022-09-17 NOTE — Progress Notes (Signed)
Cardiology Office Note:    Date:  09/17/2022   ID:  Hannah Kim, DOB May 14, 1961, MRN [id number]  PCP:  Hannah Minium, MD   [facility] Cardiologist:  None     Referring MD: Hannah Arnold, FNP    History of Present Illness:    Hannah Kim is a 62 y.o. female here for the evaluation of palpitations at the request of Dr. Birdie Kim  The ZIO monitor was performed that showed rare PVCs and PACs and 1 short run of atrial tachycardia for 5 beats.  Has been experiencing chest flutters that typically occur in the morning for about 15 beats.  Feels irregular.  Has had an increase in caffeine intake.  Has a history of prior anxiety and panic attacks.  Lif[employer] screening carotids normal  She is able to hike, exercise without any difficulties without any awareness of palpitations.  Sometimes when she is under increased stress she can feel the palpitations and she meditates, breathing exercises seem to help.  She has used acupuncture in the past.  Very rare alcohol.  Non-smoker.  Past Medical History:  Diagnosis Date   Allergy    GERD (gastroesophageal reflux disease)    History of chicken pox     Past Surgical History:  Procedure Laterality Date   COLONOSCOPY  1992   COLONOSCOPY, ESOPHAGOGASTRODUODENOSCOPY (EGD) AND ESOPHAGEAL DILATION  2015   WISDOM TOOTH EXTRACTION Bilateral 1984    Current Medications: No outpatient medications have been marked as taking for the 09/17/22 encounter (Office Visit) with Jerline Pain, MD.     Allergies:   Sulfa antibiotics   Social History   Socioeconomic History   Marital status: Single    Spouse name: Not on file   Number of children: Not on file   Years of education: Not on file   Highest education level: Not on file  Occupational History   Occupation: Teacher  Tobacco Use   Smoking status: Never   Smokeless tobacco: Never  Vaping Use   Vaping Use: Never used  Substance and Sexual Activity    Alcohol use: Yes    Comment: 2/month   Drug use: No   Sexual activity: Yes  Other Topics Concern   Not on file  Social History Narrative   Not on file   Social Determinants of Health   Financial Resource Strain: Not on file  Food Insecurity: Not on file  Transportation Needs: Not on file  Physical Activity: Not on file  Stress: Not on file  Social Connections: Not on file     Family History: The patient's family history includes Arthritis in her mother; COPD in her father; Hearing loss in her mother; Hypertension in her father, sister, and sister; Scleroderma in her mother.  ROS:   Please see the history of present illness.     All other systems reviewed and are negative.  EKGs/Labs/Other Studies Reviewed:    The following studies were reviewed today: Office notes reviewed  EKG:  09/17/21: SR 72 with PAC's  Recent Labs: 05/30/2022: ALT 13; BUN 17; Creatinine, Ser 0.81; Hemoglobin 13.3; Platelets 234.0; Potassium 4.7; Sodium 140; TSH 1.84  Recent Lipid Panel    Component Value Date/Time   CHOL 192 05/30/2022 0806   TRIG 44.0 05/30/2022 0806   HDL 70.60 05/30/2022 0806   CHOLHDL 3 05/30/2022 0806   VLDL 8.8 05/30/2022 0806   LDLCALC 113 (H) 05/30/2022 0806     Risk Assessment/Calculations:  Physical Exam:    VS:  BP 130/74   Pulse 72   Ht 5' 7.5" (1.715 m)   Wt 164 lb (74.4 kg)   LMP 04/23/2013   SpO2 98%   BMI 25.31 kg/m     Wt Readings from Last 3 Encounters:  09/17/22 164 lb (74.4 kg)  08/27/22 162 lb 9.6 oz (73.8 kg)  08/12/22 162 lb (73.5 kg)     GEN:  Well nourished, well developed in no acute distress HEENT: Normal NECK: No JVD; No carotid bruits LYMPHATICS: No lymphadenopathy CARDIAC: RRR, no murmurs, rubs, gallops RESPIRATORY:  Clear to auscultation without rales, wheezing or rhonchi  ABDOMEN: Soft, non-tender, non-distended MUSCULOSKELETAL:  No edema; No deformity  SKIN: Warm and dry NEUROLOGIC:  Alert and oriented  x 3 PSYCHIATRIC:  Normal affect   ASSESSMENT:    1. Heart palpitations   2. Nonspecific abnormal electrocardiogram (ECG) (EKG)    PLAN:    In order of problems listed above:  Palpitations - ZIO monitor reviewed as above.  Showed her images of the strips.  Benign PACs PVCs.  Occasional atrial tachycardia.  No evidence of atrial fibrillation.  We will go ahead and check an echocardiogram to ensure proper structure and function of her heart.  If unremarkable continue with conservative management strategies.  We did mention the possibility of pharmacotherapy such as metoprolol or diltiazem however at this time we will avoid.  Good sleep hygiene, stress reduction, exercise will be helpful.  She knows to reach out to [place] if symptoms worsen or become more worrisome.  She has had no high risk symptoms such as syncope.          Medication Adjustments/Labs and Tests Ordered: Current medicines are reviewed at length with the patient today.  Concerns regarding medicines are outlined above.  Orders Placed This Encounter  Procedures   EKG 12-Lead   ECHOCARDIOGRAM COMPLETE   No orders of the defined types were placed in this encounter.   Patient Instructions  Medication Instructions:  The current medical regimen is effective;  continue present plan and medications.  *If you need a refill on your cardiac medications before your next appointment, please call your pharmacy*   Testing/Procedures: Your physician has requested that you have an echocardiogram. Echocardiography is a painless test that uses sound waves to create images of your heart. It provides your doctor with information about the size and shape of your heart and how well your heart's chambers and valves are working. This procedure takes approximately one hour. There are no restrictions for this procedure. Please do NOT wear cologne, perfume, aftershave, or lotions (deodorant is allowed). Please arrive 15 minutes prior to your  appointment time.   Follow-Up: At [facility], you and your health needs are our priority.  As part of our continuing mission to provide you with exceptional heart care, we have created designated Provider Care Teams.  These Care Teams include your primary Cardiologist (physician) and Advanced Practice Providers (APPs -  Physician Assistants and Nurse Practitioners) who all work together to provide you with the care you need, when you need it.  We recommend signing up for the patient portal called "MyChart".  Sign up information is provided on this After Visit Summary.  MyChart is used to connect with patients for Virtual Visits (Telemedicine).  Patients are able to view lab/test results, encounter notes, upcoming appointments, etc.  Non-urgent messages can be sent to your provider as well.   To learn more about  what you can do with MyChart, go to [email or web address].    Your next appointment:   Follow up will be based on the results of the above testing.     Signed, Candee Furbish, MD  09/17/2022 5:13 PM    [place]

## 2022-09-17 NOTE — Patient Instructions (Signed)
Medication Instructions:  The current medical regimen is effective;  continue present plan and medications.  *If you need a refill on your cardiac medications before your next appointment, please call your pharmacy*  Testing/Procedures: Your physician has requested that you have an echocardiogram. Echocardiography is a painless test that uses sound waves to create images of your heart. It provides your doctor with information about the size and shape of your heart and how well your heart's chambers and valves are working. This procedure takes approximately one hour. There are no restrictions for this procedure. Please do NOT wear cologne, perfume, aftershave, or lotions (deodorant is allowed). Please arrive 15 minutes prior to your appointment time.  Follow-Up: At [facility], you and your health needs are our priority.  As part of our continuing mission to provide you with exceptional heart care, we have created designated Provider Care Teams.  These Care Teams include your primary Cardiologist (physician) and Advanced Practice Providers (APPs -  Physician Assistants and Nurse Practitioners) who all work together to provide you with the care you need, when you need it.  We recommend signing up for the patient portal called "MyChart".  Sign up information is provided on this After Visit Summary.  MyChart is used to connect with patients for Virtual Visits (Telemedicine).  Patients are able to view lab/test results, encounter notes, upcoming appointments, etc.  Non-urgent messages can be sent to your provider as well.   To learn more about what you can do with MyChart, go to [email or web address].    Your next appointment:   Follow up will be based on the results of the above testing.   

## 2022-10-13 ENCOUNTER — Ambulatory Visit (HOSPITAL_COMMUNITY): Payer: BC Managed Care – PPO | Attending: Cardiology

## 2022-10-13 DIAGNOSIS — R002 Palpitations: Secondary | ICD-10-CM | POA: Diagnosis not present

## 2022-10-13 DIAGNOSIS — R9431 Abnormal electrocardiogram [ECG] [EKG]: Secondary | ICD-10-CM | POA: Diagnosis not present

## 2022-10-13 LAB — ECHOCARDIOGRAM COMPLETE
Area-P 1/2: 3.42 cm2
MV M vel: 4.9 m/s
MV Peak grad: 96 mmHg
S' Lateral: 3.4 cm

## 2022-10-17 ENCOUNTER — Telehealth: Payer: Self-pay | Admitting: Family Medicine

## 2022-10-17 NOTE — Telephone Encounter (Signed)
Caller name: Bettey Palmatier Stenglein  On DPR?: Yes  Call back number: [phone] (mobile)  Provider they see: Midge Minium, MD  Reason for call: Patient called stating that she has been having weird  feelings in her left hand and left lower leg. Patient states that it doesn't hurt and its not numb. Patient would like to speak with a nurse about her concerns. Patient also states that sometimes its switches to her right side.

## 2022-10-17 NOTE — Telephone Encounter (Signed)
Spoke to the pt  and she states she is having some pain and weird feelings in her left hand and legs that comes and goes. Denies any sharp or aching feeling . Denies any numbness but tingling  Burning sensation at times. Advised pt that she needs an apt to be treated accordingly

## 2022-10-20 ENCOUNTER — Encounter: Payer: Self-pay | Admitting: Family Medicine

## 2022-10-20 ENCOUNTER — Ambulatory Visit (INDEPENDENT_AMBULATORY_CARE_PROVIDER_SITE_OTHER): Payer: BC Managed Care – PPO | Admitting: Family Medicine

## 2022-10-20 VITALS — BP 116/80 | HR 91 | Temp 98.0°F | Resp 17 | Ht 67.5 in | Wt 161.4 lb

## 2022-10-20 DIAGNOSIS — R4589 Other symptoms and signs involving emotional state: Secondary | ICD-10-CM | POA: Insufficient documentation

## 2022-10-20 DIAGNOSIS — F418 Other specified anxiety disorders: Secondary | ICD-10-CM

## 2022-10-20 MED ORDER — SERTRALINE HCL 25 MG PO TABS: 25.0000 mg | ORAL_TABLET | Freq: Every day | ORAL | 3 refills | Status: DC

## 2022-10-20 NOTE — Progress Notes (Signed)
   Subjective:    Patient ID: Hannah Kim, female    DOB: 1960/10/19, 62 y.o.   MRN: [id number]  HPI Numbness- pt woke up on Friday and felt that her L hand was numb and something in her lower legs.  Hand numbness resolved spontaneously.  Numbness resolved w/ massaging lower legs w/ tennis balls.  Pt has recently had issues w/ positional dizziness- is doing exercises for it.  Finds that she gets very anxious w/ the dizzy episodes and now the numbness.  Yesterday was completely asymptomatic, today is asymptomatic.  Pt does meditation and exercises for relaxation.  Notes that she seems to develop physical symptoms when she is anxious.  Feels like she has never been anxious before but is now acutely aware of everything that feels 'off' and wonders if it's the sign of something more serious.   Review of Systems For ROS see HPI     Objective:   Physical Exam Vitals reviewed.  Constitutional:      General: She is not in acute distress.    Appearance: Normal appearance. She is not ill-appearing.  HENT:     Head: Normocephalic and atraumatic.  Eyes:     Extraocular Movements: Extraocular movements intact.     Conjunctiva/sclera: Conjunctivae normal.     Pupils: Pupils are equal, round, and reactive to light.  Cardiovascular:     Rate and Rhythm: Normal rate and regular rhythm.  Pulmonary:     Effort: Pulmonary effort is normal. No respiratory distress.     Breath sounds: Normal breath sounds.  Skin:    General: Skin is warm and dry.  Neurological:     General: No focal deficit present.     Mental Status: She is alert and oriented to person, place, and time.  Psychiatric:     Comments: anxious           Assessment & Plan:

## 2022-10-20 NOTE — Assessment & Plan Note (Signed)
New.  Pt states that she has never worried over 'every little ache and pain or twinge' the way she does now.  She finds that once she starts to worry about the particular symptom, things seem to amplify and worsen.  She reports sxs seem to improve w/ a good night's sleep and relaxation/meditation.  Reassured her that the numbness in her hand was most likely positional due to sleep.  The fact that she was able to massage the 'funny feeling' out of her lower legs is also reassuring.  Pt is very nervous that she may minimize a physical sx and it will end up being far worse.  She is also fairly fearful of medication.  Is open to the idea of starting a low dose daily SSRI to decrease baseline anxiety level.  Will start Sertraline '25mg'$  daily and monitor closely for improvement.  Will titrate prn.

## 2022-10-20 NOTE — Patient Instructions (Signed)
Follow up in 3-4 weeks to recheck anxiety START the Sertraline once daily- take w/ food Continue to work on meditation and relaxation- these are great tools Call with any questions or concerns Stay Safe!  Stay Healthy! Hang in there!!!

## 2022-11-17 ENCOUNTER — Ambulatory Visit: Payer: BC Managed Care – PPO | Admitting: Family Medicine

## 2022-11-17 ENCOUNTER — Ambulatory Visit (INDEPENDENT_AMBULATORY_CARE_PROVIDER_SITE_OTHER): Payer: BC Managed Care – PPO | Admitting: Family Medicine

## 2022-11-17 ENCOUNTER — Encounter: Payer: Self-pay | Admitting: Family Medicine

## 2022-11-17 VITALS — BP 116/70 | HR 77 | Temp 97.9°F | Resp 17 | Ht 67.5 in | Wt 160.4 lb

## 2022-11-17 DIAGNOSIS — J301 Allergic rhinitis due to pollen: Secondary | ICD-10-CM | POA: Diagnosis not present

## 2022-11-17 DIAGNOSIS — R4589 Other symptoms and signs involving emotional state: Secondary | ICD-10-CM

## 2022-11-17 NOTE — Progress Notes (Unsigned)
   Subjective:    Patient ID: Hannah Kim, female    DOB: [birth year], 61 y.o.   MRN: [id number]  HPI Anxiety- pt was started on Sertraline 25mg  at last visit.  Pt reports feeling better since starting medication.  Is feeling less anxious, is sleeping better.  Notes that anxiety worsens when she doesn't sleep well.  'it's been night and day'  HA- pt reports some nasal congestion, HA on L side of head.  Has not taken any medication.  Sxs started Saturday after mowing yard.  No fevers, cough, ear pain.   Review of Systems For ROS see HPI     Objective:   Physical Exam Vitals reviewed.  Constitutional:      General: She is not in acute distress.    Appearance: Normal appearance. She is well-developed.  HENT:     Head: Normocephalic and atraumatic.     Right Ear: Tympanic membrane normal.     Left Ear: Tympanic membrane normal.     Nose: Mucosal edema and congestion present. No rhinorrhea.     Right Sinus: No maxillary sinus tenderness or frontal sinus tenderness.     Left Sinus: No maxillary sinus tenderness or frontal sinus tenderness.     Mouth/Throat:     Pharynx: Posterior oropharyngeal erythema (w/ PND) present.  Eyes:     Conjunctiva/sclera: Conjunctivae normal.     Pupils: Pupils are equal, round, and reactive to light.  Cardiovascular:     Rate and Rhythm: Normal rate and regular rhythm.  Pulmonary:     Effort: Pulmonary effort is normal. No respiratory distress.  Musculoskeletal:     Cervical back: Normal range of motion and neck supple.  Lymphadenopathy:     Cervical: No cervical adenopathy.  Skin:    General: Skin is warm and dry.  Neurological:     General: No focal deficit present.     Mental Status: She is alert and oriented to person, place, and time.  Psychiatric:        Mood and Affect: Mood normal.        Behavior: Behavior normal.        Thought Content: Thought content normal.           Assessment & Plan:

## 2022-11-17 NOTE — Patient Instructions (Addendum)
Schedule your complete physical for after 05/31/23 Continue the Sertraline daily ADD a daily Claritin or Zyrtec to help w/ seasonal allergies Drink LOTS of fluids to rinse the drainage off the back of your throat Call with any questions or concerns Stay Safe!  Stay Healthy! Happy Spring!!!

## 2022-11-18 DIAGNOSIS — J301 Allergic rhinitis due to pollen: Secondary | ICD-10-CM | POA: Insufficient documentation

## 2022-11-18 NOTE — Assessment & Plan Note (Signed)
New.  Pt's HA and congestion started after mowing her lawn this weekend.  No evidence of infection on PE.  Encouraged her to start daily antihistamine for symptom relief.  Pt expressed understanding and is in agreement w/ plan.

## 2022-11-18 NOTE — Assessment & Plan Note (Signed)
Improved w/ addition of Sertraline.  She feels much better and finds that she no longer goes into a spiral when she feels something is wrong.  No med changes at this time.  Will follow.

## 2022-11-25 ENCOUNTER — Other Ambulatory Visit: Payer: Self-pay

## 2022-11-25 MED ORDER — SERTRALINE HCL 25 MG PO TABS: 25.0000 mg | ORAL_TABLET | Freq: Every day | ORAL | 1 refills | Status: DC

## 2022-12-01 ENCOUNTER — Other Ambulatory Visit: Payer: Self-pay

## 2022-12-01 MED ORDER — SERTRALINE HCL 25 MG PO TABS: 25.0000 mg | ORAL_TABLET | Freq: Every day | ORAL | 1 refills | Status: DC

## 2023-01-08 ENCOUNTER — Encounter: Payer: Self-pay | Admitting: Family Medicine

## 2023-02-05 IMAGING — MG MM DIGITAL DIAGNOSTIC UNILAT*R* W/ TOMO W/ CAD
8 series · 8 of 24 positions shown · non-contrast
Comparison: Previous exam(s).

CLINICAL DATA: 59-year-old female presenting as a recall from
screening for possible right breast mass.

EXAM:
DIGITAL DIAGNOSTIC UNILATERAL RIGHT MAMMOGRAM WITH TOMOSYNTHESIS AND
CAD; ULTRASOUND RIGHT BREAST LIMITED
TECHNIQUE: Right digital diagnostic mammography and breast tomosynthesis was
performed. The images were evaluated with computer-aided detection.;
Targeted ultrasound examination of the right breast was performed

[R MLO synth-2D (1 of 2)]
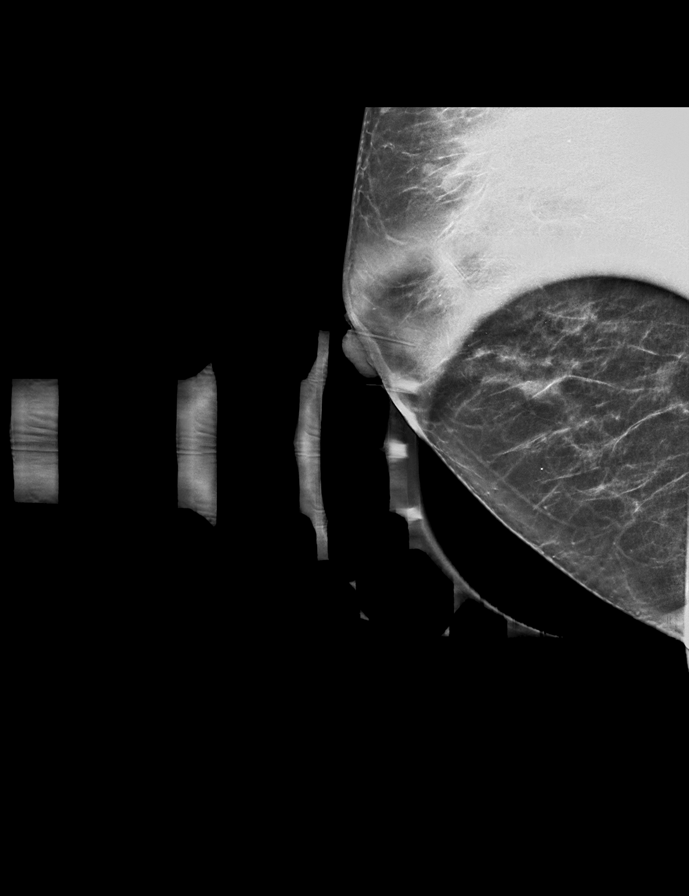

[R CC synth-2D (1 of 2)]
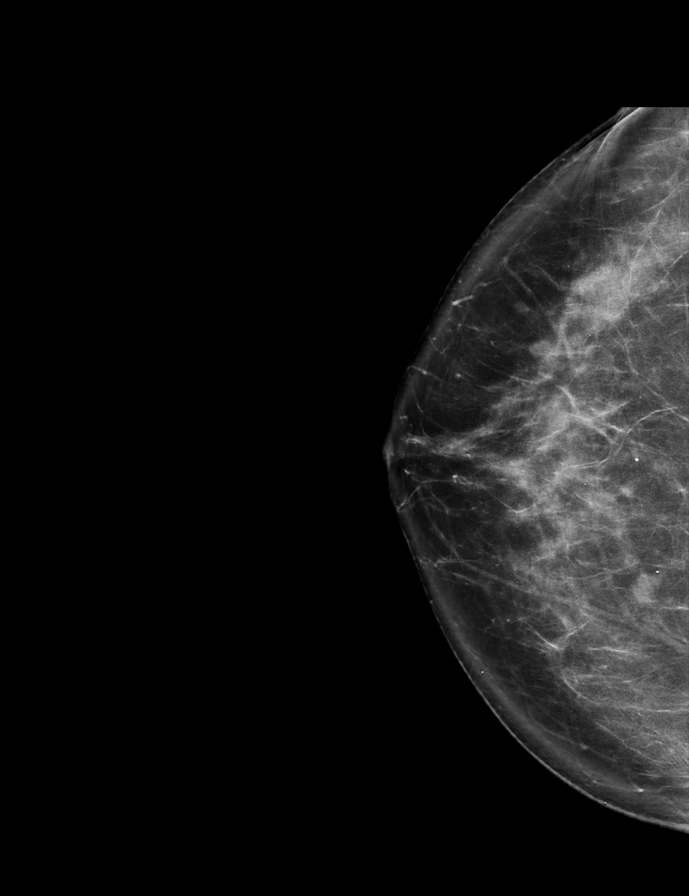

[R CC synth-2D (2 of 2)]
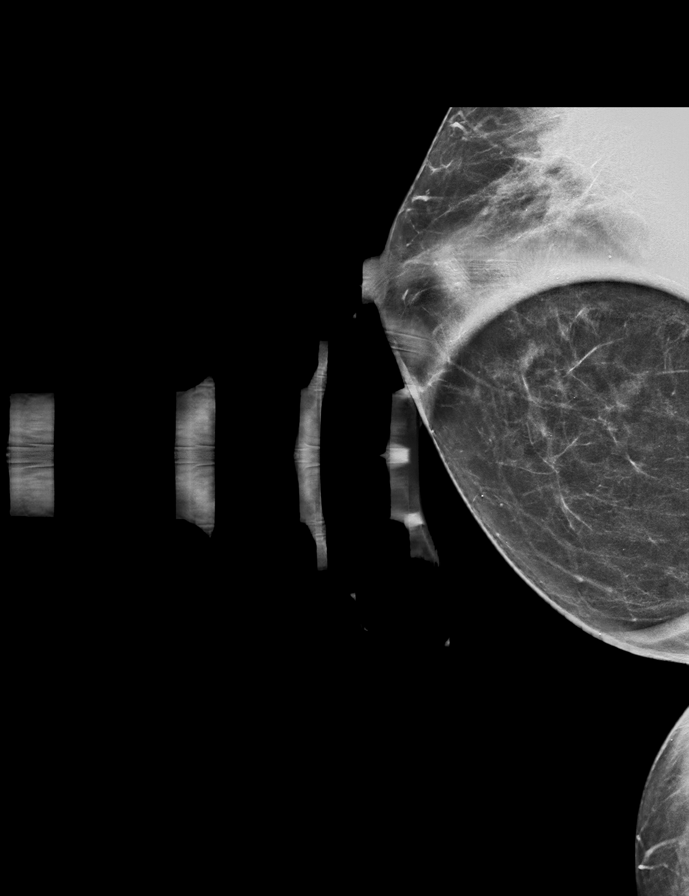

[R MLO synth-2D (2 of 2)]
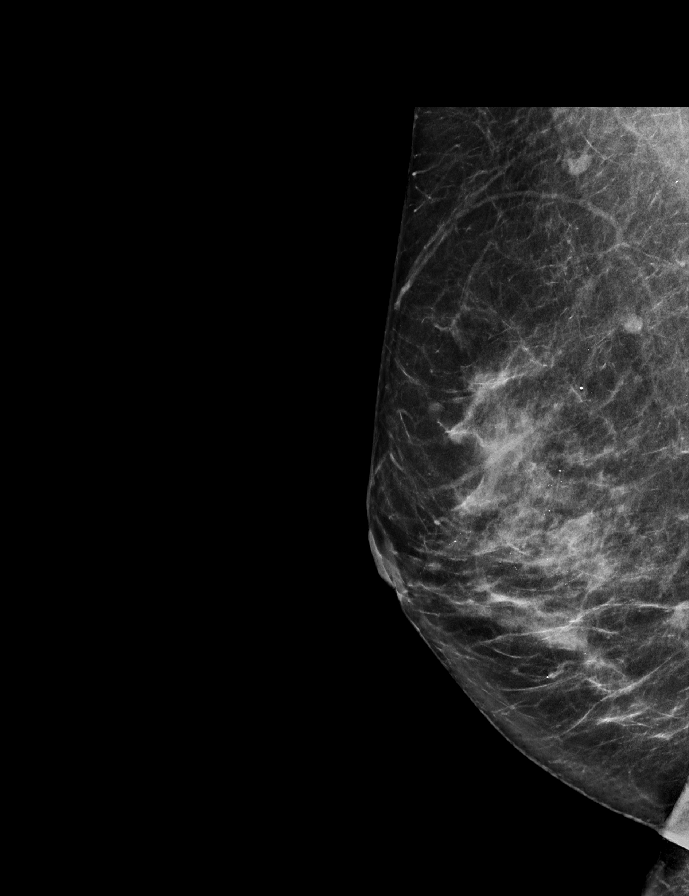

[R CC tomo (1 of 2) · tomo slice 41/81.0]
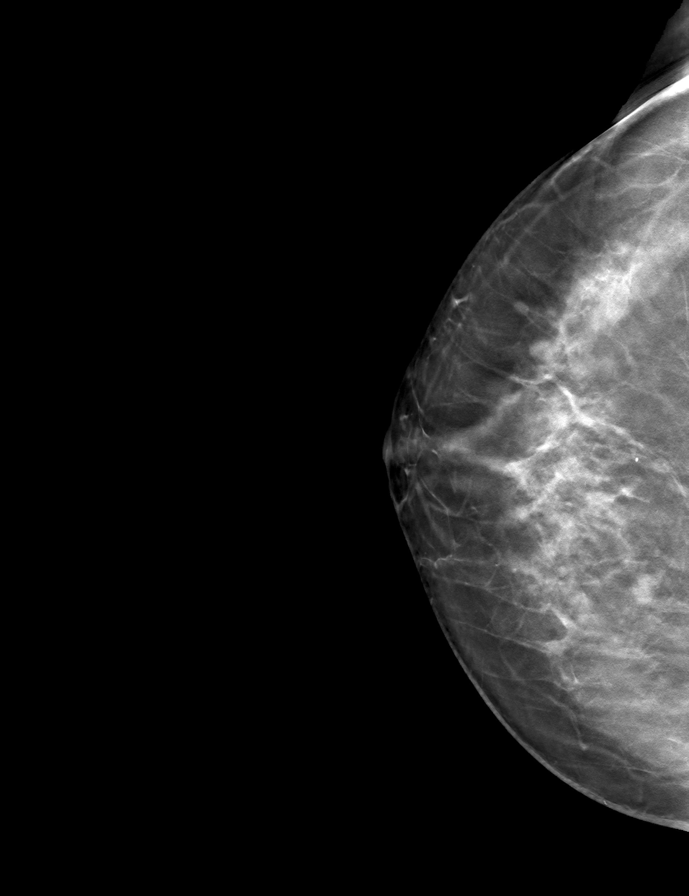

[R CC tomo (2 of 2) · tomo slice 31/62.0]
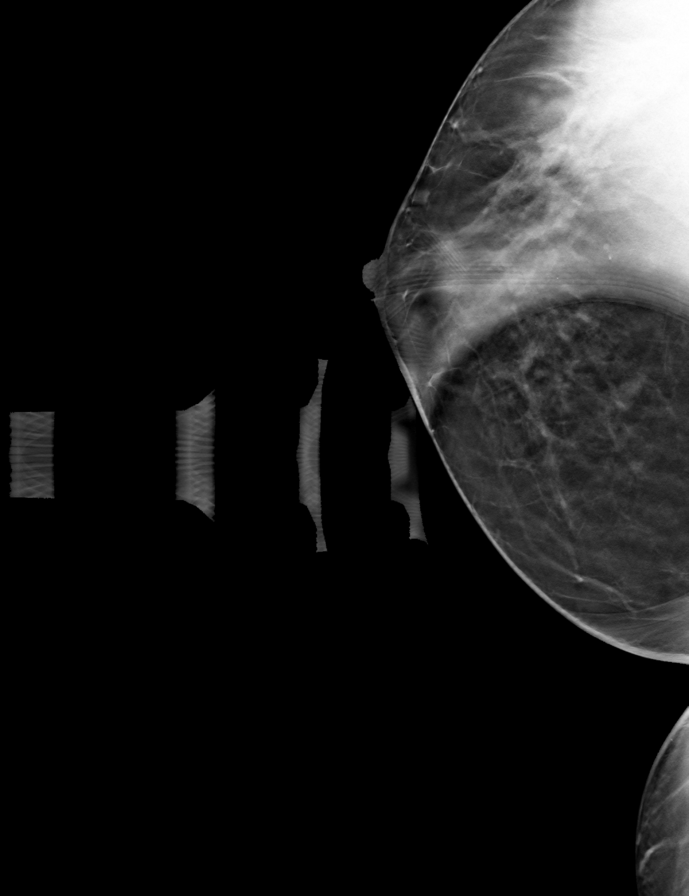

[R MLO tomo (1 of 2) · tomo slice 29/57.0]
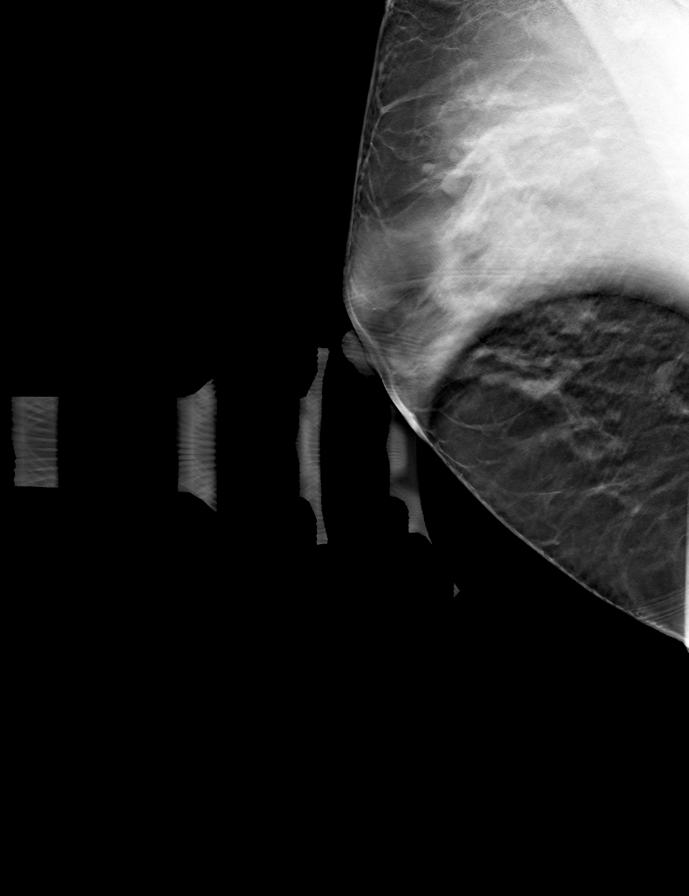

[R MLO tomo (2 of 2) · tomo slice 34/67.0]
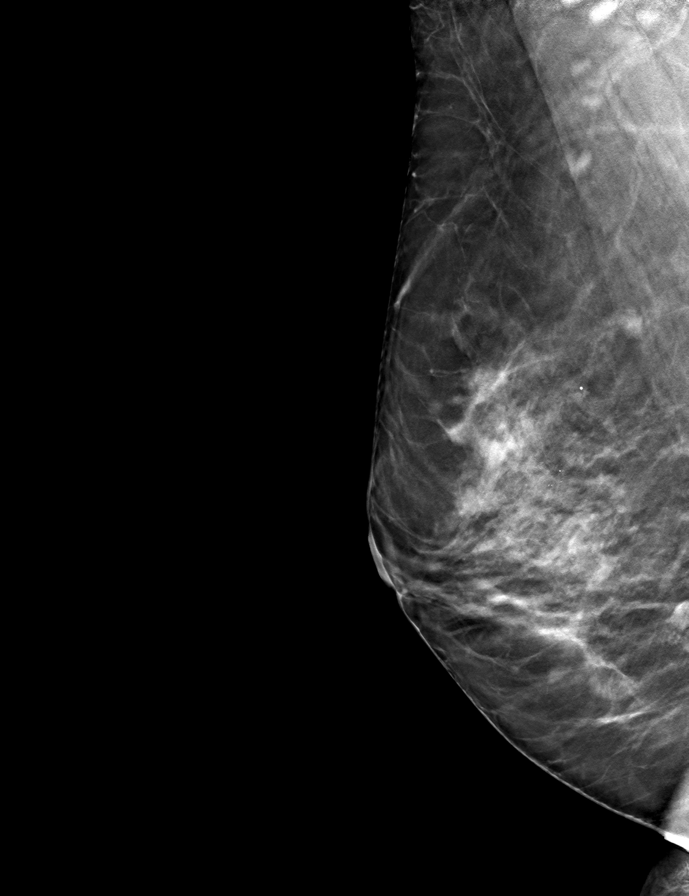

[8 of 24 positions shown; findings below may reference images not displayed]

ACR Breast Density Category c: The breast tissue is heterogeneously
dense, which may obscure small masses.
FINDINGS: Mammogram:

Spot compression tomosynthesis views of the right breast were
performed demonstrating persistence of a small mass in the lower
inner right breast posterior depth measuring approximately 0.7 cm.

Ultrasound:

Targeted ultrasound performed in the right breast at 4 o'clock 2 cm
from the nipple demonstrating an oval circumscribed nearly anechoic
mass measuring 0.5 x 0.5 x 0.8 cm, likely a complicated cyst. No
internal vascularity. This corresponds to the mammographic finding.
IMPRESSION: Probably benign mass in the right breast at 4 o'clock, likely a
complicated cyst.

RECOMMENDATION:
Right breast ultrasound in 6 months.

I have discussed the findings and recommendations with the patient
who agrees to short-term follow-up.

BI-RADS CATEGORY  3: Probably benign.

## 2023-02-27 ENCOUNTER — Encounter: Payer: Self-pay | Admitting: Family Medicine

## 2023-02-27 ENCOUNTER — Ambulatory Visit (INDEPENDENT_AMBULATORY_CARE_PROVIDER_SITE_OTHER): Payer: BC Managed Care – PPO | Admitting: Family Medicine

## 2023-02-27 VITALS — BP 102/68 | HR 80 | Temp 99.1°F | Ht 67.5 in | Wt 161.2 lb

## 2023-02-27 DIAGNOSIS — T63464A Toxic effect of venom of wasps, undetermined, initial encounter: Secondary | ICD-10-CM

## 2023-02-27 MED ORDER — PREDNISONE 10 MG PO TABS
ORAL_TABLET | ORAL | 0 refills | Status: AC
Start: 2023-02-27 — End: 2023-03-04

## 2023-02-27 MED ORDER — DOXYCYCLINE HYCLATE 100 MG PO TABS: 100.0000 mg | ORAL_TABLET | Freq: Two times a day (BID) | ORAL | 0 refills | Status: DC

## 2023-02-27 MED ORDER — DOXYCYCLINE HYCLATE 100 MG PO TABS
100.0000 mg | ORAL_TABLET | Freq: Two times a day (BID) | ORAL | 0 refills | Status: AC
Start: 2023-02-27 — End: 2023-03-09

## 2023-02-27 NOTE — Patient Instructions (Addendum)
-  Prescribed Prednisone 10mg , 6 day taper. Recommend to take with food. Do not take NSAIDS, such as Ibuprofen, Advil, Naproxen, and Aleve, with medication.  -Prescribed Doxycycline 100mg  twice a day for 7 days for possible infection.  -Recommend over the counter Benadryl. It may cause you to be sleepy. -Recommend ice compresses 4-6 times a day, up to 20 minutes at a time.  -If the redness and swelling become worse and cross over the line that was made in office on your leg, please go to closes emergency department.  -If not improved, follow up.

## 2023-02-27 NOTE — Progress Notes (Signed)
Acute Office Visit   Subjective:  Patient ID: Hannah Kim, female    DOB: Jun 27, 1961, 62 y.o.   MRN: [id number]  Chief Complaint  Patient presents with   Insect Bite    Pt is here today with C/O of sting yesterday Pt reports she has used Triamcinolone cream  Pt reports she wanted to make sure this is not infected Sx-RED AND BURNING (LITTLE)    HPI Patient is 62 year old female that got sting by a wasp yesterday. She used Triamcinolone cream this morning. Today, she noticed that the area had become more red and burning than last night and this morning.   Patient has not took Benadryl and did cold compresses last night.   ROS See HPI above     Objective:   BP 102/68   Pulse 80   Temp 99.1 F (37.3 C)   Ht 5' 7.5" (1.715 m)   Wt 161 lb 4 oz (73.1 kg)   LMP 04/23/2013   SpO2 99%   BMI 24.88 kg/m    Physical Exam Vitals reviewed.  Constitutional:      General: She is not in acute distress.    Appearance: Normal appearance. She is not ill-appearing, toxic-appearing or diaphoretic.  HENT:     Head: Normocephalic and atraumatic.  Eyes:     General:        Right eye: No discharge.        Left eye: No discharge.     Conjunctiva/sclera: Conjunctivae normal.  Cardiovascular:     Rate and Rhythm: Normal rate.  Pulmonary:     Effort: Pulmonary effort is normal. No respiratory distress.  Musculoskeletal:        General: Normal range of motion.  Skin:    General: Skin is warm and dry.     Comments: Right thigh-redness and swelling; picture included   Neurological:     General: No focal deficit present.     Mental Status: She is alert and oriented to person, place, and time. Mental status is at baseline.  Psychiatric:        Mood and Affect: Mood normal.        Behavior: Behavior normal.        Thought Content: Thought content normal.        Judgment: Judgment normal.          Assessment & Plan:  Wasp sting, undetermined intent, initial  encounter  Other orders -     predniSONE; Take 6 tablets (60 mg total) by mouth daily with breakfast for 1 day, THEN 5 tablets (50 mg total) daily with breakfast for 1 day, THEN 4 tablets (40 mg total) daily with breakfast for 1 day, THEN 3 tablets (30 mg total) daily with breakfast for 1 day, THEN 2 tablets (20 mg total) daily with breakfast for 1 day, THEN 1 tablet (10 mg total) daily with breakfast for 1 day.  Dispense: 21 tablet; Refill: 0 -     Doxycycline Hyclate; Take 1 tablet (100 mg total) by mouth 2 (two) times daily for 10 days.  Dispense: 20 tablet; Refill: 0  -Prescribed Prednisone 10mg , 6 day taper for swelling and redness. Recommend to take with food. Do not take NSAIDS, such as Ibuprofen, Advil, Naproxen, and Aleve, with medication.  -Prescribed Doxycycline 100mg  twice a day for 7 days for possible infection.  -Recommend over the counter Benadryl. Advised it can cause drowsy.  -Recommend ice compresses 4-6 times a day, up to  20 minutes at a time.  -Advised if the redness and swelling become worse and cross over the line that was made in office on your leg, please go to closes emergency department.  -If not improved, follow up.   Zandra Abts, NP

## 2023-03-02 ENCOUNTER — Encounter: Payer: Self-pay | Admitting: Family Medicine

## 2023-04-07 ENCOUNTER — Other Ambulatory Visit: Payer: Self-pay | Admitting: Obstetrics and Gynecology

## 2023-04-07 DIAGNOSIS — E2839 Other primary ovarian failure: Secondary | ICD-10-CM

## 2023-04-07 DIAGNOSIS — Z01419 Encounter for gynecological examination (general) (routine) without abnormal findings: Secondary | ICD-10-CM | POA: Diagnosis not present

## 2023-04-07 DIAGNOSIS — Z1231 Encounter for screening mammogram for malignant neoplasm of breast: Secondary | ICD-10-CM | POA: Diagnosis not present

## 2023-04-16 ENCOUNTER — Encounter: Payer: Self-pay | Admitting: Family Medicine

## 2023-05-29 ENCOUNTER — Ambulatory Visit (INDEPENDENT_AMBULATORY_CARE_PROVIDER_SITE_OTHER): Payer: BC Managed Care – PPO | Admitting: Family Medicine

## 2023-05-29 ENCOUNTER — Encounter: Payer: Self-pay | Admitting: Family Medicine

## 2023-05-29 VITALS — BP 104/68 | HR 69 | Temp 97.8°F | Ht 67.5 in | Wt 169.1 lb

## 2023-05-29 DIAGNOSIS — Z1159 Encounter for screening for other viral diseases: Secondary | ICD-10-CM

## 2023-05-29 DIAGNOSIS — Z114 Encounter for screening for human immunodeficiency virus [HIV]: Secondary | ICD-10-CM

## 2023-05-29 DIAGNOSIS — E785 Hyperlipidemia, unspecified: Secondary | ICD-10-CM

## 2023-05-29 DIAGNOSIS — Z0000 Encounter for general adult medical examination without abnormal findings: Secondary | ICD-10-CM

## 2023-05-29 LAB — CBC WITH DIFFERENTIAL/PLATELET
Basophils Absolute: 0.1 10*3/uL (ref 0.0–0.1)
Basophils Relative: 1 % (ref 0.0–3.0)
Eosinophils Absolute: 0.2 10*3/uL (ref 0.0–0.7)
Eosinophils Relative: 3.6 % (ref 0.0–5.0)
HCT: 39.3 % (ref 36.0–46.0)
Hemoglobin: 12.7 g/dL (ref 12.0–15.0)
Lymphocytes Relative: 29.9 % (ref 12.0–46.0)
Lymphs Abs: 2 10*3/uL (ref 0.7–4.0)
MCHC: 32.4 g/dL (ref 30.0–36.0)
MCV: 92.7 fL (ref 78.0–100.0)
Monocytes Absolute: 0.4 10*3/uL (ref 0.1–1.0)
Monocytes Relative: 6.1 % (ref 3.0–12.0)
Neutro Abs: 3.9 10*3/uL (ref 1.4–7.7)
Neutrophils Relative %: 59.4 % (ref 43.0–77.0)
Platelets: 214 10*3/uL (ref 150.0–400.0)
RBC: 4.24 Mil/uL (ref 3.87–5.11)
RDW: 13.9 % (ref 11.5–15.5)
WBC: 6.5 10*3/uL (ref 4.0–10.5)

## 2023-05-29 LAB — BASIC METABOLIC PANEL
BUN: 21 mg/dL (ref 6–23)
CO2: 27 meq/L (ref 19–32)
Calcium: 9.6 mg/dL (ref 8.4–10.5)
Chloride: 105 meq/L (ref 96–112)
Creatinine, Ser: 0.67 mg/dL (ref 0.40–1.20)
GFR: 94.69 mL/min (ref >60.00)
Glucose, Bld: 85 mg/dL (ref 70–99)
Potassium: 4.8 meq/L (ref 3.5–5.1)
Sodium: 140 meq/L (ref 135–145)

## 2023-05-29 LAB — HEPATIC FUNCTION PANEL
ALT: 20 U/L (ref 0–35)
AST: 25 U/L (ref 0–37)
Albumin: 4.2 g/dL (ref 3.5–5.2)
Alkaline Phosphatase: 62 U/L (ref 39–117)
Bilirubin, Direct: 0 mg/dL (ref 0.0–0.3)
Total Bilirubin: 0.3 mg/dL (ref 0.2–1.2)
Total Protein: 7.3 g/dL (ref 6.0–8.3)

## 2023-05-29 LAB — LIPID PANEL
Cholesterol: 213 mg/dL — ABNORMAL HIGH (ref 0–200)
HDL: 70.8 mg/dL (ref >39.00)
LDL Cholesterol: 130 mg/dL — ABNORMAL HIGH (ref 0–99)
NonHDL: 141.94
Total CHOL/HDL Ratio: 3
Triglycerides: 60 mg/dL (ref 0.0–149.0)
VLDL: 12 mg/dL (ref 0.0–40.0)

## 2023-05-29 LAB — TSH: TSH: 2.19 u[IU]/mL (ref 0.35–5.50)

## 2023-05-29 NOTE — Progress Notes (Signed)
   Subjective:    Patient ID: Hannah Kim, female    DOB: [birth year], 62 y.o.   MRN: [id number]  HPI CPE- UTD on mammo, colonoscopy, pap, Tdap.  Patient Care Team    Relationship Specialty Notifications Start End  Sheliah Hatch, MD PCP - General Family Medicine  09/02/17     Health Maintenance  Topic Date Due   Hepatitis C Screening  Never done   COVID-19 Vaccine (5 - 2023-24 season) 04/12/2023   HIV Screening  05/31/2023 (Originally [birth year]/1977)   MAMMOGRAM  04/02/2024   Colonoscopy  09/02/2024   Cervical Cancer Screening (HPV/Pap Cotest)  04/03/2027   DTaP/Tdap/Td (4 - Td or Tdap) 07/13/2028   INFLUENZA VACCINE  Completed   Zoster Vaccines- Shingrix  Completed   HPV VACCINES  Aged Out      Review of Systems Patient reports no vision/ hearing changes, adenopathy,fever, persistant/recurrent hoarseness , swallowing issues, chest pain, palpitations, edema, persistant/recurrent cough, hemoptysis, dyspnea (rest/exertional/paroxysmal nocturnal), gastrointestinal bleeding (melena, rectal bleeding), abdominal pain, significant heartburn, bowel changes, GU symptoms (dysuria, hematuria, incontinence), Gyn symptoms (abnormal  bleeding, pain),  syncope, focal weakness, memory loss, numbness & tingling, skin/hair/nail changes, abnormal bruising or bleeding, anxiety, or depression.   + 8 lb weight gain    Objective:   Physical Exam General Appearance:    Alert, cooperative, no distress, appears stated age  Head:    Normocephalic, without obvious abnormality, atraumatic  Eyes:    PERRL, conjunctiva/corneas clear, EOM's intact both eyes  Ears:    Normal TM's and external ear canals, both ears  Nose:   Nares normal, septum midline, mucosa normal, no drainage    or sinus tenderness  Throat:   Lips, mucosa, and tongue normal; teeth and gums normal  Neck:   Supple, symmetrical, trachea midline, no adenopathy;    Thyroid: no enlargement/tenderness/nodules  Back:     Symmetric, no  curvature, ROM normal, no CVA tenderness  Lungs:     Clear to auscultation bilaterally, respirations unlabored  Chest Wall:    No tenderness or deformity   Heart:    Regular rate and rhythm, S1 and S2 normal, no murmur, rub   or gallop  Breast Exam:    Deferred to mammo  Abdomen:     Soft, non-tender, bowel sounds active all four quadrants,    no masses, no organomegaly  Genitalia:    Deferred  Rectal:    Extremities:   Extremities normal, atraumatic, no cyanosis or edema  Pulses:   2+ and symmetric all extremities  Skin:   Skin color, texture, turgor normal, no rashes or lesions  Lymph nodes:   Cervical, supraclavicular, and axillary nodes normal  Neurologic:   CNII-XII intact, normal strength, sensation and reflexes    throughout          Assessment & Plan:

## 2023-05-29 NOTE — Patient Instructions (Signed)
Follow up in 1 year or as needed We'll notify you of your lab results and make any changes if needed Keep up the good work on healthy diet and regular exercise- you look great!! Call with any questions or concerns Stay Safe!  Stay Healthy! Enjoy your trip!!! 

## 2023-05-29 NOTE — Assessment & Plan Note (Signed)
Pt's PE WNL.  UTD on mammo, colonoscopy, pap, Tdap.  Check labs.  Anticipatory guidance provided.

## 2023-05-30 LAB — HEPATITIS C ANTIBODY: Hepatitis C Ab: NONREACTIVE

## 2023-05-30 LAB — HIV ANTIBODY (ROUTINE TESTING W REFLEX): HIV 1&2 Ab, 4th Generation: NONREACTIVE

## 2023-06-02 ENCOUNTER — Telehealth: Payer: Self-pay

## 2023-06-02 NOTE — Telephone Encounter (Signed)
-----   Message from Neena Rhymes sent at 06/02/2023  7:57 AM EDT ----- Labs look great w/ exception of total cholesterol and LDL (bad cholesterol) which have increased somewhat since last check.  Thankfully the ratio of good to bad cholesterol is still great!  No need for medication.  Continue to work on healthy diet and regular exercise.

## 2023-06-05 ENCOUNTER — Encounter: Payer: BC Managed Care – PPO | Admitting: Family Medicine

## 2023-06-29 ENCOUNTER — Encounter: Payer: Self-pay | Admitting: Family Medicine

## 2023-07-15 ENCOUNTER — Ambulatory Visit (INDEPENDENT_AMBULATORY_CARE_PROVIDER_SITE_OTHER): Payer: BC Managed Care – PPO | Admitting: Dermatology

## 2023-07-15 ENCOUNTER — Encounter: Payer: Self-pay | Admitting: Dermatology

## 2023-07-15 VITALS — BP 130/83 | HR 67

## 2023-07-15 DIAGNOSIS — D235 Other benign neoplasm of skin of trunk: Secondary | ICD-10-CM | POA: Diagnosis not present

## 2023-07-15 DIAGNOSIS — D229 Melanocytic nevi, unspecified: Secondary | ICD-10-CM

## 2023-07-15 DIAGNOSIS — L578 Other skin changes due to chronic exposure to nonionizing radiation: Secondary | ICD-10-CM

## 2023-07-15 DIAGNOSIS — D1801 Hemangioma of skin and subcutaneous tissue: Secondary | ICD-10-CM

## 2023-07-15 DIAGNOSIS — Z1283 Encounter for screening for malignant neoplasm of skin: Secondary | ICD-10-CM | POA: Diagnosis not present

## 2023-07-15 DIAGNOSIS — L853 Xerosis cutis: Secondary | ICD-10-CM | POA: Diagnosis not present

## 2023-07-15 DIAGNOSIS — D492 Neoplasm of unspecified behavior of bone, soft tissue, and skin: Secondary | ICD-10-CM

## 2023-07-15 DIAGNOSIS — L821 Other seborrheic keratosis: Secondary | ICD-10-CM

## 2023-07-15 DIAGNOSIS — L814 Other melanin hyperpigmentation: Secondary | ICD-10-CM

## 2023-07-15 DIAGNOSIS — D489 Neoplasm of uncertain behavior, unspecified: Secondary | ICD-10-CM

## 2023-07-15 DIAGNOSIS — W908XXA Exposure to other nonionizing radiation, initial encounter: Secondary | ICD-10-CM

## 2023-07-15 NOTE — Patient Instructions (Signed)
 Important Information  Due to recent changes in healthcare laws, you may see results of your pathology and/or laboratory studies on MyChart before the doctors have had a chance to review them. We understand that in some cases there may be results that are confusing or concerning to you. Please understand that not all results are received at the same time and often the doctors may need to interpret multiple results in order to provide you with the best plan of care or course of treatment. Therefore, we ask that you please give [place] 2 business days to thoroughly review all your results before contacting the office for clarification. Should we see a critical lab result, you will be contacted sooner.   If You Need Anything After Your Visit  If you have any questions or concerns for your doctor, please call our main line at [phone] If no one answers, please leave a voicemail as directed and we will return your call as soon as possible. Messages left after 4 pm will be answered the following business day.   You may also send [place] a message via MyChart. We typically respond to MyChart messages within 1-2 business days.  For prescription refills, please ask your pharmacy to contact our office. Our fax number is [phone].  If you have an urgent issue when the clinic is closed that cannot wait until the next business day, you can page your doctor at the number below.    Please note that while we do our best to be available for urgent issues outside of office hours, we are not available 24/7.   If you have an urgent issue and are unable to reach [place], you may choose to seek medical care at your doctor's office, retail clinic, urgent care center, or emergency room.  If you have a medical emergency, please immediately call 911 or go to the emergency department. In the event of inclement weather, please call our main line at [phone] for an update on the status of any delays or  closures.  Dermatology Medication Tips: Please keep the boxes that topical medications come in in order to help keep track of the instructions about where and how to use these. Pharmacies typically print the medication instructions only on the boxes and not directly on the medication tubes.   If your medication is too expensive, please contact our office at [phone] or send [place] a message through MyChart.   We are unable to tell what your co-pay for medications will be in advance as this is different depending on your insurance coverage. However, we may be able to find a substitute medication at lower cost or fill out paperwork to get insurance to cover a needed medication.   If a prior authorization is required to get your medication covered by your insurance company, please allow [place] 1-2 business days to complete this process.  Drug prices often vary depending on where the prescription is filled and some pharmacies may offer cheaper prices.  The website www.goodrx.com contains coupons for medications through different pharmacies. The prices here do not account for what the cost may be with help from insurance (it may be cheaper with your insurance), but the website can give you the price if you did not use any insurance.  - You can print the associated coupon and take it with your prescription to the pharmacy.  - You may also stop by our office during regular business hours and pick up a GoodRx coupon card.  - If  you need your prescription sent electronically to a different pharmacy, notify our office through [facility] or by phone at [phone]    Skin Education :   I counseled the patient regarding the following: Sun screen (SPF 30 or greater) should be applied during peak UV exposure (between 10am and 2pm) and reapplied after exercise or swimming.  The ABCDEs of melanoma were reviewed with the patient, and the importance of monthly self-examination of moles was emphasized.  Should any moles change in shape or color, or itch, bleed or burn, pt will contact our office for evaluation sooner then their interval appointment.  Plan: Sunscreen Recommendations I recommended a broad spectrum sunscreen with a SPF of 30 or higher. I explained that SPF 30 sunscreens block approximately 97 percent of the sun's harmful rays. Sunscreens should be applied at least 15 minutes prior to expected sun exposure and then every 2 hours after that as long as sun exposure continues. If swimming or exercising sunscreen should be reapplied every 45 minutes to an hour after getting wet or sweating. One ounce, or the equivalent of a shot glass full of sunscreen, is adequate to protect the skin not covered by a bathing suit. I also recommended a lip balm with a sunscreen as well. Sun protective clothing can be used in lieu of sunscreen but must be worn the entire time you are exposed to the sun's rays.

## 2023-07-15 NOTE — Progress Notes (Signed)
   New Patient Visit   Subjective  Hannah Kim is a 62 y.o. female who presents for the following: Skin Cancer Screening and Full Body Skin Exam. No hx or family hx of skin cancer.  The patient presents for Total-Body Skin Exam (TBSE) for skin cancer screening and mole check. The patient has spots, moles and lesions to be evaluated, some may be new or changing.  The following portions of the chart were reviewed this encounter and updated as appropriate: medications, allergies, medical history  Review of Systems:  No other skin or systemic complaints except as noted in HPI or Assessment and Plan.  Objective  Well appearing patient in no apparent distress; mood and affect are within normal limits.  A full examination was performed including scalp, head, eyes, ears, nose, lips, neck, chest, axillae, abdomen, back, buttocks, bilateral upper extremities, bilateral lower extremities, hands, feet, fingers, toes, fingernails, and toenails. All findings within normal limits unless otherwise noted below.   Relevant physical exam findings are noted in the Assessment and Plan.  Chest - Medial [facility])      Assessment & Plan   SKIN CANCER SCREENING PERFORMED TODAY.  ACTINIC DAMAGE - Chronic condition, secondary to cumulative UV/sun exposure - diffuse scaly erythematous macules with underlying dyspigmentation - Recommend daily broad spectrum sunscreen SPF 30+ to sun-exposed areas, reapply every 2 hours as needed.  - Staying in the shade or wearing long sleeves, sun glasses (UVA+UVB protection) and wide brim hats (4-inch brim around the entire circumference of the hat) are also recommended for sun protection.  - Call for new or changing lesions.  LENTIGINES, SEBORRHEIC KERATOSES, HEMANGIOMAS - Benign normal skin lesions - Benign-appearing - Call for any changes  MELANOCYTIC NEVI - Tan-brown and/or pink-flesh-colored symmetric macules and papules - Benign appearing on exam today -  Observation - Call clinic for new or changing moles - Recommend daily use of broad spectrum spf 30+ sunscreen to sun-exposed areas.   Xerosis - diffuse xerotic patches - recommend gentle, hydrating skin care - gentle skin care handout given  Neoplasm of uncertain behavior-Ddx Xanthoma vx Xanthelasma vs JXG vs NXG vs SK vs other Chest - Medial (Center)  Skin / nail biopsy Type of biopsy: tangential   Informed consent: discussed and consent obtained   Timeout: patient name, date of birth, surgical site, and procedure verified   Anesthesia: the lesion was anesthetized in a standard fashion   Anesthetic:  1% lidocaine w/ epinephrine 1-100,000 buffered w/ 8.4% NaHCO3 Instrument used: DermaBlade   Hemostasis achieved with: aluminum chloride   Outcome: patient tolerated procedure well   Post-procedure details: sterile dressing applied and wound care instructions given   Dressing type: petrolatum and bandage      Return in about 1 year (around 07/14/2024) for tbsc.  I, Tillie Fantasia, CMA, am acting as scribe for Gwenith Daily, MD.   Documentation: I have reviewed the above documentation for accuracy and completeness, and I agree with the above.  Gwenith Daily, MD

## 2023-07-17 LAB — SURGICAL PATHOLOGY

## 2023-08-05 IMAGING — US US BREAST*R* LIMITED INC AXILLA
1 series · 4 of 4 positions shown · non-contrast
Comparison: Previous exam(s).

CLINICAL DATA: Six-month follow-up of a probably benign right
breast mass

EXAM:
ULTRASOUND OF THE RIGHT BREAST

[Series 1: us breast*right* limited inc axilla · 0.06mm/px · 4 of 4 slices shown]
[im 1/4]
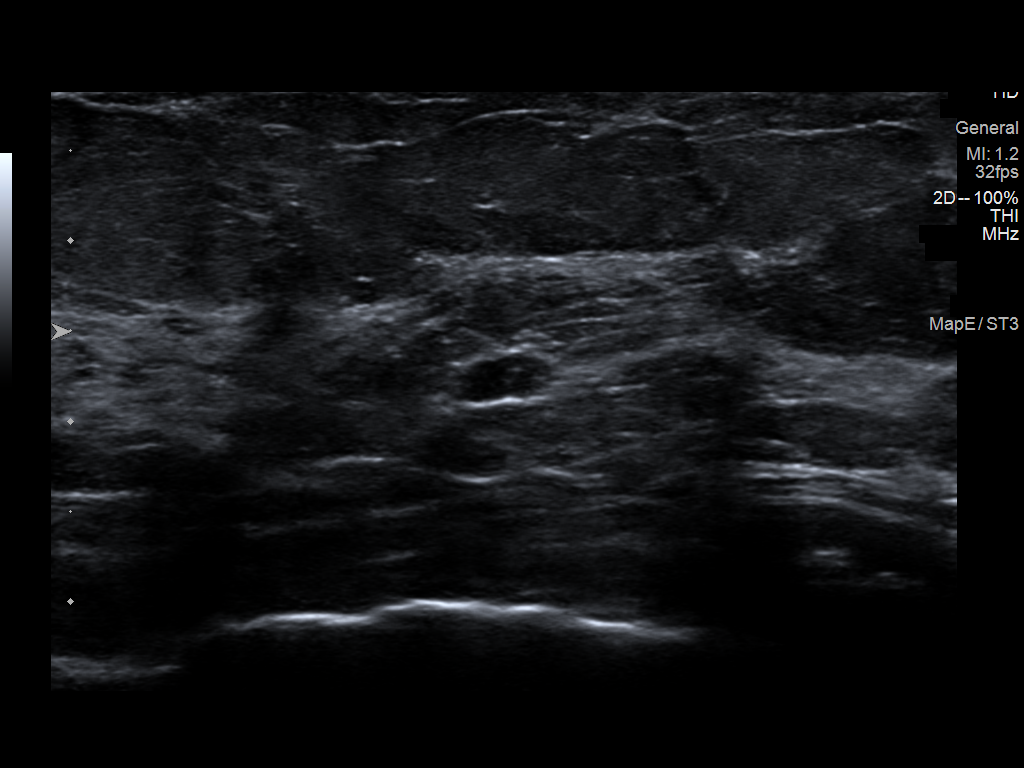
[im 2/4]
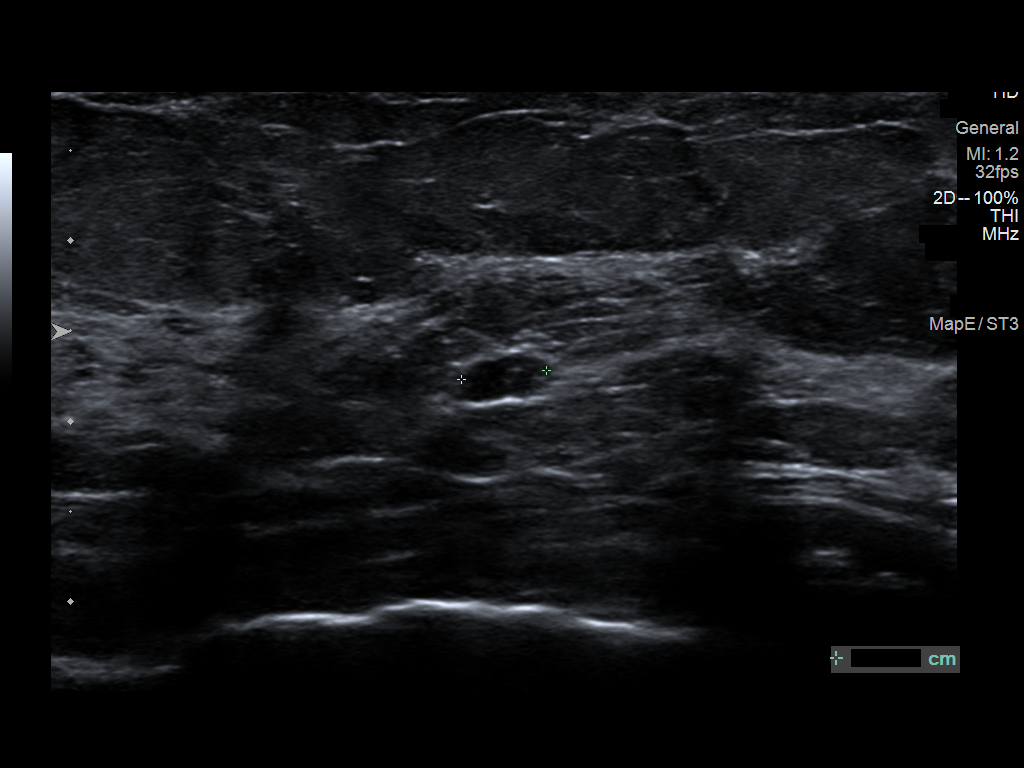
[im 3/4]
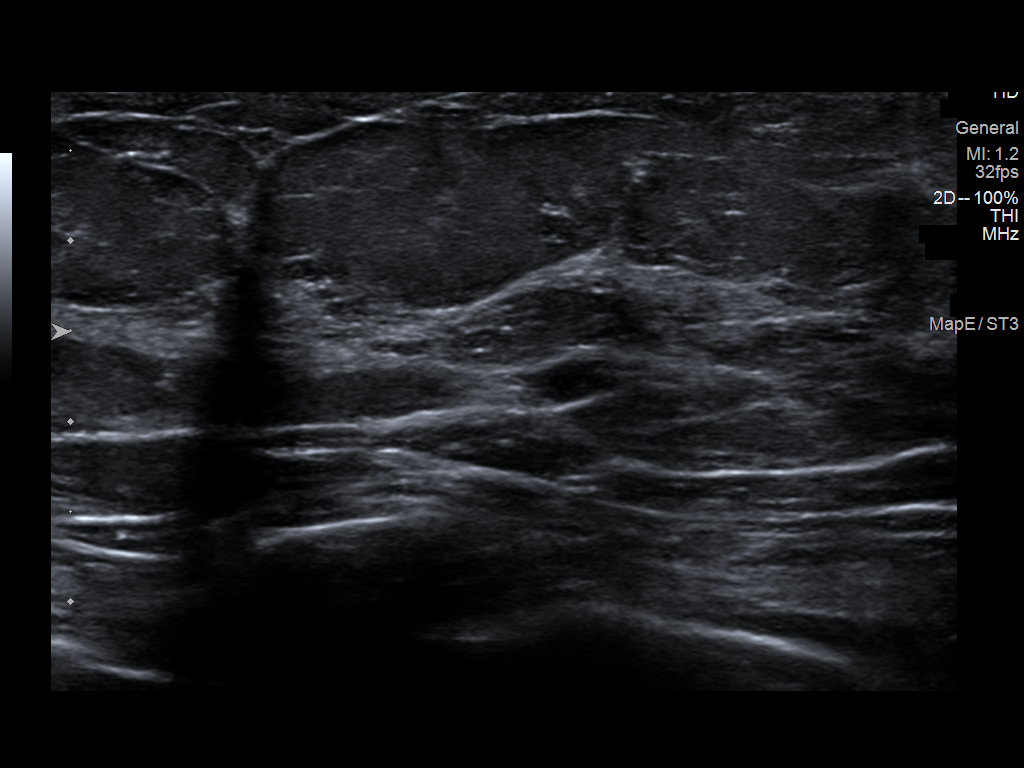
[im 4/4]
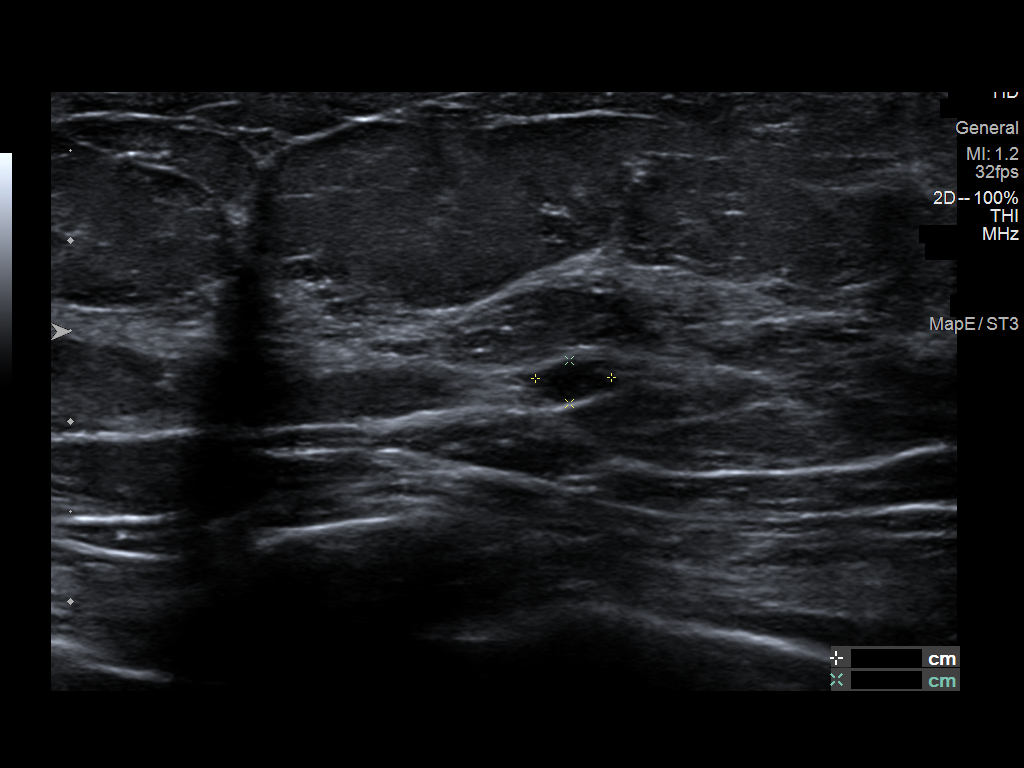

[4 of 4 positions shown; findings below may reference images not displayed]

FINDINGS: On physical exam, no suspicious lumps are identified.

Targeted ultrasound is performed, showing the right breast mass at 4
o'clock, 2 cm from the nipple measuring 5 by 4 x 2 mm today versus 8
by 5 x 5 mm previously, much smaller in the interval.
IMPRESSION: The a previously identified mass at 4 o'clock is much smaller in the
interval consistent with a benign etiology.

RECOMMENDATION:
Recommend returning to annual screening mammography next due in
March 2022.

I have discussed the findings and recommendations with the patient.
If applicable, a reminder letter will be sent to the patient
regarding the next appointment.

BI-RADS CATEGORY  2: Benign.

## 2023-10-07 ENCOUNTER — Encounter: Payer: Self-pay | Admitting: Family Medicine

## 2023-10-14 ENCOUNTER — Ambulatory Visit
Admission: RE | Admit: 2023-10-14 | Discharge: 2023-10-14 | Disposition: A | Discharge status: Home / Self Care | Payer: BC Managed Care – PPO | Source: Ambulatory Visit | Attending: Obstetrics and Gynecology

## 2023-10-14 DIAGNOSIS — M8588 Other specified disorders of bone density and structure, other site: Secondary | ICD-10-CM | POA: Diagnosis not present

## 2023-10-14 DIAGNOSIS — N958 Other specified menopausal and perimenopausal disorders: Secondary | ICD-10-CM | POA: Diagnosis not present

## 2023-10-14 DIAGNOSIS — E2839 Other primary ovarian failure: Secondary | ICD-10-CM

## 2023-11-03 DIAGNOSIS — Z1211 Encounter for screening for malignant neoplasm of colon: Secondary | ICD-10-CM | POA: Diagnosis not present

## 2023-11-03 DIAGNOSIS — R635 Abnormal weight gain: Secondary | ICD-10-CM | POA: Diagnosis not present

## 2023-12-21 ENCOUNTER — Other Ambulatory Visit: Payer: Self-pay

## 2023-12-21 DIAGNOSIS — Z1211 Encounter for screening for malignant neoplasm of colon: Secondary | ICD-10-CM | POA: Diagnosis not present

## 2023-12-21 DIAGNOSIS — K621 Rectal polyp: Secondary | ICD-10-CM | POA: Diagnosis not present

## 2023-12-21 LAB — HM COLONOSCOPY

## 2024-02-17 ENCOUNTER — Telehealth: Payer: Self-pay

## 2024-02-17 NOTE — Telephone Encounter (Signed)
 Copied from CRM [id number]. Topic: Clinical - Request for Lab/Test Order >> Feb 17, 2024 10:01 AM Chiquita SQUIBB wrote: Reason for CRM: Patient is requesting to have her blood work done before her physical appointment. Please advise the patient  if she is able to.

## 2024-02-17 NOTE — Telephone Encounter (Signed)
 I am fine with her coming in a few weeks prior to her CPE.  It would be best to send a message regarding this in October so things don't get done too early or lost in the shuffle of the next few months

## 2024-02-17 NOTE — Telephone Encounter (Signed)
 Pt has been informed and will call or message in [facility] October for these

## 2024-02-17 NOTE — Telephone Encounter (Signed)
 Physical is in November patient is requesting labs prior to appt, please advise

## 2024-03-16 ENCOUNTER — Encounter: Payer: Self-pay | Admitting: Family Medicine

## 2024-03-16 NOTE — Telephone Encounter (Signed)
 Patient sent MyChart message asking what vaccines she should get for her upcoming trip out of the country? Please see MyChart message for more details. Please advise, thank you

## 2024-04-07 DIAGNOSIS — Z130 Encounter for screening for diseases of the blood and blood-forming organs and certain disorders involving the immune mechanism: Secondary | ICD-10-CM | POA: Diagnosis not present

## 2024-04-07 DIAGNOSIS — Z01419 Encounter for gynecological examination (general) (routine) without abnormal findings: Secondary | ICD-10-CM | POA: Diagnosis not present

## 2024-04-07 DIAGNOSIS — Z124 Encounter for screening for malignant neoplasm of cervix: Secondary | ICD-10-CM | POA: Diagnosis not present

## 2024-04-07 DIAGNOSIS — Z1231 Encounter for screening mammogram for malignant neoplasm of breast: Secondary | ICD-10-CM | POA: Diagnosis not present

## 2024-04-07 DIAGNOSIS — Z1151 Encounter for screening for human papillomavirus (HPV): Secondary | ICD-10-CM | POA: Diagnosis not present

## 2024-04-07 LAB — HM MAMMOGRAPHY

## 2024-04-13 ENCOUNTER — Other Ambulatory Visit (HOSPITAL_BASED_OUTPATIENT_CLINIC_OR_DEPARTMENT_OTHER): Payer: Self-pay

## 2024-04-13 MED ORDER — AREXVY 120 MCG/0.5ML IM SUSR
0.5000 mL | Freq: Once | INTRAMUSCULAR | 0 refills | Status: AC
  Filled 2024-04-13: qty 0.5, 1d supply, fill #0

## 2024-05-16 ENCOUNTER — Telehealth: Payer: Self-pay

## 2024-05-16 NOTE — Telephone Encounter (Signed)
 Patient has a physical on 11/12 and would like to have lab work completed that morning so she can fast for her labs. Is this okay for patient to complete? Any orders that I need to place?

## 2024-05-16 NOTE — Telephone Encounter (Signed)
 Copied from CRM [id number]. Topic: Clinical - Request for Lab/Test Order >> May 16, 2024  9:11 AM Robinson H wrote: Reason for CRM: Patient wants fasting labs done the morning of her physical 11/12, states she can't wait that long to eat and wants to come that morning to have labs.  [place] [phone]

## 2024-05-18 ENCOUNTER — Other Ambulatory Visit (HOSPITAL_BASED_OUTPATIENT_CLINIC_OR_DEPARTMENT_OTHER): Payer: Self-pay

## 2024-05-18 MED ORDER — CAPVAXIVE 0.5 ML IM SOSY
PREFILLED_SYRINGE | INTRAMUSCULAR | 0 refills | Status: DC
  Filled 2024-05-18: qty 0.5, 1d supply, fill #0

## 2024-05-18 NOTE — Telephone Encounter (Signed)
 Pt called back for an update. Please call and advise.

## 2024-05-18 NOTE — Telephone Encounter (Signed)
 Called patient back and she is okay with fasting for 4 hours prior to appt.

## 2024-05-23 ENCOUNTER — Other Ambulatory Visit (HOSPITAL_BASED_OUTPATIENT_CLINIC_OR_DEPARTMENT_OTHER): Payer: Self-pay

## 2024-05-23 MED ORDER — COMIRNATY 30 MCG/0.3ML IM SUSY
0.3000 mL | PREFILLED_SYRINGE | Freq: Once | INTRAMUSCULAR | 0 refills | Status: AC
Start: 2024-05-23 — End: 2024-05-24
  Filled 2024-05-23: qty 0.3, 1d supply, fill #0

## 2024-06-01 ENCOUNTER — Encounter: Payer: BC Managed Care – PPO | Admitting: Family Medicine

## 2024-06-06 NOTE — Telephone Encounter (Signed)
 She can definitely eat breakfast or even a mid morning snack and then just skip lunch before her physical appt

## 2024-06-06 NOTE — Telephone Encounter (Signed)
 Called patient to relay message below, Left Vm to return call

## 2024-06-22 ENCOUNTER — Ambulatory Visit: Admitting: Family Medicine

## 2024-06-22 VITALS — BP 114/64 | HR 71 | Temp 97.9°F | Ht 67.5 in | Wt 173.4 lb

## 2024-06-22 DIAGNOSIS — Z0000 Encounter for general adult medical examination without abnormal findings: Secondary | ICD-10-CM | POA: Diagnosis not present

## 2024-06-22 DIAGNOSIS — E785 Hyperlipidemia, unspecified: Secondary | ICD-10-CM

## 2024-06-22 NOTE — Progress Notes (Signed)
   Subjective:    Patient ID: Hannah Kim, female    DOB: [birth year], 62 y.o.   MRN: [id number]  HPI CPE- UTD on mammo, pap, Tdap, colonoscopy.  UTD on flu.  Patient Care Team    Relationship Specialty Notifications Start End  Mahlon Comer BRAVO, MD PCP - General Family Medicine  09/02/17      Health Maintenance  Topic Date Due   COVID-19 Vaccine (8 - Pfizer risk 2025-26 season) 11/21/2024   Mammogram  04/07/2026   Cervical Cancer Screening (HPV/Pap Cotest)  04/03/2027   DTaP/Tdap/Td (4 - Td or Tdap) 07/13/2028   Colonoscopy  12/20/2033   Pneumococcal Vaccine: 50+ Years  Completed   Influenza Vaccine  Completed   Hepatitis C Screening  Completed   HIV Screening  Completed   Zoster Vaccines- Shingrix  Completed   HPV VACCINES  Aged Out   Meningococcal B Vaccine  Aged Out      Review of Systems Patient reports no vision/ hearing changes, adenopathy,fever, weight change,  persistant/recurrent hoarseness , swallowing issues, chest pain, palpitations, edema, persistant/recurrent cough, hemoptysis, dyspnea (rest/exertional/paroxysmal nocturnal), gastrointestinal bleeding (melena, rectal bleeding), abdominal pain, significant heartburn, bowel changes, GU symptoms (dysuria, hematuria, incontinence), Gyn symptoms (abnormal  bleeding, pain),  syncope, focal weakness, memory loss, numbness & tingling, skin/hair/nail changes, abnormal bruising or bleeding, anxiety, or depression.     Objective:   Physical Exam General Appearance:    Alert, cooperative, no distress, appears stated age  Head:    Normocephalic, without obvious abnormality, atraumatic  Eyes:    PERRL, conjunctiva/corneas clear, EOM's intact both eyes  Ears:    Normal TM's and external ear canals, both ears  Nose:   Nares normal, septum midline, mucosa normal, no drainage    or sinus tenderness  Throat:   Lips, mucosa, and tongue normal; teeth and gums normal  Neck:   Supple, symmetrical, trachea midline, no  adenopathy;    Thyroid: no enlargement/tenderness/nodules  Back:     Symmetric, no curvature, ROM normal, no CVA tenderness  Lungs:     Clear to auscultation bilaterally, respirations unlabored  Chest Wall:    No tenderness or deformity   Heart:    Regular rate and rhythm, S1 and S2 normal, no murmur, rub   or gallop  Breast Exam:    Deferred to GYN  Abdomen:     Soft, non-tender, bowel sounds active all four quadrants,    no masses, no organomegaly  Genitalia:    Deferred to GYN  Rectal:    Extremities:   Extremities normal, atraumatic, no cyanosis or edema  Pulses:   2+ and symmetric all extremities  Skin:   Skin color, texture, turgor normal, no rashes or lesions  Lymph nodes:   Cervical, supraclavicular, and axillary nodes normal  Neurologic:   CNII-XII intact, normal strength, sensation and reflexes    throughout          Assessment & Plan:

## 2024-06-22 NOTE — Patient Instructions (Signed)
Follow up in 1 year or as needed We'll notify you of your lab results and make any changes if needed Keep up the good work on healthy diet and regular exercise- you look great! Call with any questions or concerns Stay Safe! Stay Healthy! Happy Holidays!!! 

## 2024-06-23 ENCOUNTER — Ambulatory Visit: Payer: Self-pay | Admitting: Family Medicine

## 2024-06-23 LAB — BASIC METABOLIC PANEL WITH GFR
BUN: 15 mg/dL (ref 6–23)
CO2: 25 meq/L (ref 19–32)
Calcium: 9.6 mg/dL (ref 8.4–10.5)
Chloride: 104 meq/L (ref 96–112)
Creatinine, Ser: 0.75 mg/dL (ref 0.40–1.20)
GFR: 85.6 mL/min (ref >60.00)
Glucose, Bld: 70 mg/dL (ref 70–99)
Potassium: 5 meq/L (ref 3.5–5.1)
Sodium: 140 meq/L (ref 135–145)

## 2024-06-23 LAB — LIPID PANEL
Cholesterol: 214 mg/dL — ABNORMAL HIGH (ref 0–200)
HDL: 68.9 mg/dL (ref >39.00)
LDL Cholesterol: 131 mg/dL — ABNORMAL HIGH (ref 0–99)
NonHDL: 145.24
Total CHOL/HDL Ratio: 3
Triglycerides: 72 mg/dL (ref 0.0–149.0)
VLDL: 14.4 mg/dL (ref 0.0–40.0)

## 2024-06-23 LAB — TSH: TSH: 2.18 u[IU]/mL (ref 0.35–5.50)

## 2024-06-23 LAB — CBC WITH DIFFERENTIAL/PLATELET
Basophils Absolute: 0.1 K/uL (ref 0.0–0.1)
Basophils Relative: 1.3 % (ref 0.0–3.0)
Eosinophils Absolute: 0.2 K/uL (ref 0.0–0.7)
Eosinophils Relative: 2.5 % (ref 0.0–5.0)
HCT: 39.7 % (ref 36.0–46.0)
Hemoglobin: 13.6 g/dL (ref 12.0–15.0)
Lymphocytes Relative: 26.9 % (ref 12.0–46.0)
Lymphs Abs: 1.9 K/uL (ref 0.7–4.0)
MCHC: 34.2 g/dL (ref 30.0–36.0)
MCV: 90.7 fl (ref 78.0–100.0)
Monocytes Absolute: 0.6 K/uL (ref 0.1–1.0)
Monocytes Relative: 8.6 % (ref 3.0–12.0)
Neutro Abs: 4.2 K/uL (ref 1.4–7.7)
Neutrophils Relative %: 60.7 % (ref 43.0–77.0)
Platelets: 229 K/uL (ref 150.0–400.0)
RBC: 4.38 Mil/uL (ref 3.87–5.11)
RDW: 13.6 % (ref 11.5–15.5)
WBC: 6.9 K/uL (ref 4.0–10.5)

## 2024-06-23 LAB — HEPATIC FUNCTION PANEL
ALT: 15 U/L (ref 0–35)
AST: 21 U/L (ref 0–37)
Albumin: 4.5 g/dL (ref 3.5–5.2)
Alkaline Phosphatase: 63 U/L (ref 39–117)
Bilirubin, Direct: 0.1 mg/dL (ref 0.0–0.3)
Total Bilirubin: 0.5 mg/dL (ref 0.2–1.2)
Total Protein: 7.6 g/dL (ref 6.0–8.3)

## 2024-06-24 ENCOUNTER — Ambulatory Visit: Payer: Self-pay

## 2024-06-24 ENCOUNTER — Ambulatory Visit (INDEPENDENT_AMBULATORY_CARE_PROVIDER_SITE_OTHER): Admitting: Family Medicine

## 2024-06-24 ENCOUNTER — Encounter: Payer: Self-pay | Admitting: Family Medicine

## 2024-06-24 VITALS — BP 110/70 | HR 66 | Temp 98.4°F | Wt 175.0 lb

## 2024-06-24 DIAGNOSIS — L723 Sebaceous cyst: Secondary | ICD-10-CM | POA: Diagnosis not present

## 2024-06-24 DIAGNOSIS — L089 Local infection of the skin and subcutaneous tissue, unspecified: Secondary | ICD-10-CM

## 2024-06-24 MED ORDER — DOXYCYCLINE HYCLATE 100 MG PO TABS: 100.0000 mg | ORAL_TABLET | Freq: Two times a day (BID) | ORAL | 0 refills | Status: AC

## 2024-06-24 NOTE — Telephone Encounter (Signed)
 Appt scheduled

## 2024-06-24 NOTE — Telephone Encounter (Signed)
 FYI Only or Action Required?: FYI only for provider: appointment scheduled on 06/24/2024 at [facility] [place].  Patient was last seen in primary care on 06/22/2024 by Mahlon Comer BRAVO, MD.  Called Nurse Triage reporting Shoulder Pain.  Symptoms began yesterday.   Triage Disposition: See Physician Within 24 Hours  Patient/caregiver understands and will follow disposition?: Yes         Copied from CRM #[id number]. Topic: Clinical - Red Word Triage >> Jun 24, 2024 12:11 PM Suzen RAMAN wrote: Red Word that prompted transfer to Nurse Triage: fatty dispose on shoulder swollen, red and sore to the touch. Requesting an appt Reason for Disposition  Looks like a boil, infected sore, deep ulcer or other infected rash (spreading redness, pus)  Answer Assessment - Initial Assessment Questions This RN scheduled pt an appointment today at [facility] [place]. Pt aware of location.   Shoulder growth has been there multiple years (pt describes as fatty dispose)  Yesterday this area became swollen, red, painful to touch (1-2/10 pain level); denies this happening before It is usually the size of a nickel now a little bigger Pt states she called her dermatologist this morning who pt saw for this last year; this office is closed on Fri Denies oozing, pus, fever  Protocols used: Shoulder Pain-A-AH

## 2024-06-24 NOTE — Progress Notes (Signed)
   Subjective:    Patient ID: Hannah Kim, female    DOB: [birth year], 63 y.o.   MRN: [id number]  HPI Here for 3 days of a tender lump on her right shoulder. The lump has been present for several years, but it has never been tender like this.    Review of Systems  Constitutional: Negative.   Respiratory: Negative.    Cardiovascular: Negative.        Objective:   Physical Exam Constitutional:      Appearance: Normal appearance.  Cardiovascular:     Rate and Rhythm: Normal rate and regular rhythm.     Pulses: Normal pulses.     Heart sounds: Normal heart sounds.  Pulmonary:     Effort: Pulmonary effort is normal.     Breath sounds: Normal breath sounds.  Skin:    Comments: There is a 2 cm firm mobile lump on the posterior right shoulder. This is red, warm, and tender   Neurological:     Mental Status: She is alert.           Assessment & Plan:  Infected sebaceous cyst. Treat with 10 days of Doxycycline . Follow up as needed.  Garnette Olmsted, MD

## 2024-07-10 NOTE — Assessment & Plan Note (Signed)
Pt's PE WNL.  UTD on pap, mammo, colonoscopy, Tdap, flu.  Check labs.  Anticipatory guidance provided.  

## 2025-06-26 ENCOUNTER — Encounter: Admitting: Family Medicine
